# Patient Record
Sex: Male | Born: 1970 | Race: Black or African American | Hispanic: No | Marital: Married | Smoking: Never smoker
Health system: Southern US, Community
[De-identification: ages and names within clinical notes are randomized; demographics above are authoritative.]

## PROBLEM LIST (undated history)

## (undated) DIAGNOSIS — I10 Essential (primary) hypertension: Secondary | ICD-10-CM

## (undated) DIAGNOSIS — N289 Disorder of kidney and ureter, unspecified: Secondary | ICD-10-CM

## (undated) DIAGNOSIS — E785 Hyperlipidemia, unspecified: Secondary | ICD-10-CM

## (undated) HISTORY — DX: Hyperlipidemia, unspecified: E78.5

## (undated) HISTORY — PX: BACK SURGERY: SHX140

---

## 1999-05-27 ENCOUNTER — Emergency Department (HOSPITAL_COMMUNITY): Admission: EM | Admit: 1999-05-27 | Discharge: 1999-05-27 | Payer: Self-pay | Admitting: Emergency Medicine

## 1999-07-12 ENCOUNTER — Emergency Department (HOSPITAL_COMMUNITY): Admission: EM | Admit: 1999-07-12 | Discharge: 1999-07-12 | Payer: Self-pay | Admitting: Emergency Medicine

## 1999-07-12 ENCOUNTER — Encounter: Payer: Self-pay | Admitting: Emergency Medicine

## 2000-02-23 ENCOUNTER — Emergency Department (HOSPITAL_COMMUNITY): Admission: EM | Admit: 2000-02-23 | Discharge: 2000-02-23 | Payer: Self-pay | Admitting: Emergency Medicine

## 2000-02-26 ENCOUNTER — Encounter: Payer: Self-pay | Admitting: Emergency Medicine

## 2000-02-26 ENCOUNTER — Emergency Department (HOSPITAL_COMMUNITY): Admission: EM | Admit: 2000-02-26 | Discharge: 2000-02-26 | Payer: Self-pay | Admitting: Emergency Medicine

## 2000-11-03 ENCOUNTER — Encounter: Payer: Self-pay | Admitting: Emergency Medicine

## 2000-11-03 ENCOUNTER — Observation Stay (HOSPITAL_COMMUNITY): Admission: EM | Admit: 2000-11-03 | Discharge: 2000-11-04 | Payer: Self-pay | Admitting: Emergency Medicine

## 2000-11-03 ENCOUNTER — Encounter: Payer: Self-pay | Admitting: Family Medicine

## 2001-08-21 ENCOUNTER — Emergency Department (HOSPITAL_COMMUNITY): Admission: EM | Admit: 2001-08-21 | Discharge: 2001-08-21 | Payer: Self-pay | Admitting: Emergency Medicine

## 2001-09-01 ENCOUNTER — Encounter: Admission: RE | Admit: 2001-09-01 | Discharge: 2001-10-28 | Payer: Self-pay | Admitting: Orthopedic Surgery

## 2001-09-22 ENCOUNTER — Encounter: Payer: Self-pay | Admitting: Orthopedic Surgery

## 2001-09-22 ENCOUNTER — Ambulatory Visit (HOSPITAL_COMMUNITY): Admission: RE | Admit: 2001-09-22 | Discharge: 2001-09-22 | Payer: Self-pay | Admitting: Orthopedic Surgery

## 2002-02-26 ENCOUNTER — Encounter: Payer: Self-pay | Admitting: Neurosurgery

## 2002-03-03 ENCOUNTER — Ambulatory Visit (HOSPITAL_COMMUNITY): Admission: RE | Admit: 2002-03-03 | Discharge: 2002-03-04 | Payer: Self-pay | Admitting: Neurosurgery

## 2002-03-03 ENCOUNTER — Encounter: Payer: Self-pay | Admitting: Neurosurgery

## 2002-04-28 ENCOUNTER — Emergency Department (HOSPITAL_COMMUNITY): Admission: EM | Admit: 2002-04-28 | Discharge: 2002-04-28 | Payer: Self-pay | Admitting: Emergency Medicine

## 2002-05-30 ENCOUNTER — Encounter: Payer: Self-pay | Admitting: Emergency Medicine

## 2002-05-30 ENCOUNTER — Emergency Department (HOSPITAL_COMMUNITY): Admission: EM | Admit: 2002-05-30 | Discharge: 2002-05-30 | Payer: Self-pay | Admitting: *Deleted

## 2003-03-03 ENCOUNTER — Emergency Department (HOSPITAL_COMMUNITY): Admission: AD | Admit: 2003-03-03 | Discharge: 2003-03-03 | Payer: Self-pay | Admitting: Family Medicine

## 2003-03-25 ENCOUNTER — Emergency Department (HOSPITAL_COMMUNITY): Admission: AD | Admit: 2003-03-25 | Discharge: 2003-03-25 | Payer: Self-pay | Admitting: Family Medicine

## 2003-04-07 ENCOUNTER — Emergency Department (HOSPITAL_COMMUNITY): Admission: AD | Admit: 2003-04-07 | Discharge: 2003-04-07 | Payer: Self-pay | Admitting: Family Medicine

## 2003-06-23 ENCOUNTER — Emergency Department (HOSPITAL_COMMUNITY): Admission: AD | Admit: 2003-06-23 | Discharge: 2003-06-23 | Payer: Self-pay | Admitting: Emergency Medicine

## 2003-08-17 ENCOUNTER — Emergency Department (HOSPITAL_COMMUNITY): Admission: AD | Admit: 2003-08-17 | Discharge: 2003-08-17 | Payer: Self-pay | Admitting: Family Medicine

## 2004-09-22 ENCOUNTER — Emergency Department (HOSPITAL_COMMUNITY): Admission: EM | Admit: 2004-09-22 | Discharge: 2004-09-22 | Payer: Self-pay | Admitting: Emergency Medicine

## 2005-02-08 ENCOUNTER — Emergency Department (HOSPITAL_COMMUNITY): Admission: EM | Admit: 2005-02-08 | Discharge: 2005-02-08 | Payer: Self-pay | Admitting: Emergency Medicine

## 2005-10-28 ENCOUNTER — Emergency Department (HOSPITAL_COMMUNITY): Admission: EM | Admit: 2005-10-28 | Discharge: 2005-10-29 | Payer: Self-pay | Admitting: Emergency Medicine

## 2005-11-02 ENCOUNTER — Emergency Department (HOSPITAL_COMMUNITY): Admission: EM | Admit: 2005-11-02 | Discharge: 2005-11-03 | Payer: Self-pay | Admitting: Emergency Medicine

## 2005-11-02 ENCOUNTER — Emergency Department (HOSPITAL_COMMUNITY): Admission: EM | Admit: 2005-11-02 | Discharge: 2005-11-02 | Payer: Self-pay | Admitting: Family Medicine

## 2005-12-04 ENCOUNTER — Ambulatory Visit: Payer: Self-pay | Admitting: Family Medicine

## 2006-01-01 ENCOUNTER — Ambulatory Visit: Payer: Self-pay | Admitting: Family Medicine

## 2006-01-08 ENCOUNTER — Ambulatory Visit: Payer: Self-pay | Admitting: *Deleted

## 2006-02-01 ENCOUNTER — Ambulatory Visit: Payer: Self-pay | Admitting: Family Medicine

## 2006-02-22 ENCOUNTER — Ambulatory Visit: Payer: Self-pay | Admitting: Family Medicine

## 2006-02-22 DIAGNOSIS — Z8719 Personal history of other diseases of the digestive system: Secondary | ICD-10-CM

## 2006-03-21 ENCOUNTER — Ambulatory Visit: Payer: Self-pay | Admitting: Family Medicine

## 2006-04-18 ENCOUNTER — Ambulatory Visit: Payer: Self-pay | Admitting: Family Medicine

## 2006-10-08 ENCOUNTER — Ambulatory Visit: Payer: Self-pay | Admitting: Family Medicine

## 2006-10-08 DIAGNOSIS — M545 Low back pain: Secondary | ICD-10-CM

## 2006-10-11 ENCOUNTER — Ambulatory Visit: Payer: Self-pay | Admitting: Internal Medicine

## 2006-10-25 ENCOUNTER — Ambulatory Visit: Payer: Self-pay | Admitting: Internal Medicine

## 2006-11-15 ENCOUNTER — Ambulatory Visit: Payer: Self-pay | Admitting: Internal Medicine

## 2006-12-30 DIAGNOSIS — E7800 Pure hypercholesterolemia, unspecified: Secondary | ICD-10-CM | POA: Insufficient documentation

## 2006-12-30 DIAGNOSIS — E785 Hyperlipidemia, unspecified: Secondary | ICD-10-CM

## 2006-12-30 DIAGNOSIS — M109 Gout, unspecified: Secondary | ICD-10-CM

## 2006-12-30 DIAGNOSIS — I10 Essential (primary) hypertension: Secondary | ICD-10-CM

## 2006-12-31 ENCOUNTER — Telehealth (INDEPENDENT_AMBULATORY_CARE_PROVIDER_SITE_OTHER): Payer: Self-pay | Admitting: Internal Medicine

## 2007-01-12 DIAGNOSIS — K219 Gastro-esophageal reflux disease without esophagitis: Secondary | ICD-10-CM | POA: Insufficient documentation

## 2007-01-22 ENCOUNTER — Encounter (INDEPENDENT_AMBULATORY_CARE_PROVIDER_SITE_OTHER): Payer: Self-pay | Admitting: *Deleted

## 2007-01-24 ENCOUNTER — Telehealth (INDEPENDENT_AMBULATORY_CARE_PROVIDER_SITE_OTHER): Payer: Self-pay | Admitting: Family Medicine

## 2007-02-03 ENCOUNTER — Telehealth (INDEPENDENT_AMBULATORY_CARE_PROVIDER_SITE_OTHER): Payer: Self-pay | Admitting: Internal Medicine

## 2007-03-15 ENCOUNTER — Encounter (INDEPENDENT_AMBULATORY_CARE_PROVIDER_SITE_OTHER): Payer: Self-pay | Admitting: Internal Medicine

## 2007-04-09 ENCOUNTER — Telehealth (INDEPENDENT_AMBULATORY_CARE_PROVIDER_SITE_OTHER): Payer: Self-pay | Admitting: Internal Medicine

## 2007-04-23 ENCOUNTER — Telehealth (INDEPENDENT_AMBULATORY_CARE_PROVIDER_SITE_OTHER): Payer: Self-pay | Admitting: Internal Medicine

## 2007-11-26 ENCOUNTER — Emergency Department (HOSPITAL_COMMUNITY): Admission: EM | Admit: 2007-11-26 | Discharge: 2007-11-27 | Payer: Self-pay | Admitting: Emergency Medicine

## 2008-05-12 ENCOUNTER — Emergency Department (HOSPITAL_COMMUNITY): Admission: EM | Admit: 2008-05-12 | Discharge: 2008-05-12 | Payer: Self-pay | Admitting: Family Medicine

## 2009-01-04 ENCOUNTER — Emergency Department (HOSPITAL_COMMUNITY): Admission: EM | Admit: 2009-01-04 | Discharge: 2009-01-04 | Payer: Self-pay | Admitting: Family Medicine

## 2009-10-24 ENCOUNTER — Emergency Department (HOSPITAL_COMMUNITY): Admission: EM | Admit: 2009-10-24 | Discharge: 2009-10-24 | Payer: Self-pay | Admitting: Emergency Medicine

## 2010-09-15 ENCOUNTER — Other Ambulatory Visit: Payer: Self-pay | Admitting: Family Medicine

## 2010-09-15 DIAGNOSIS — G8929 Other chronic pain: Secondary | ICD-10-CM

## 2010-09-18 ENCOUNTER — Ambulatory Visit
Admission: RE | Admit: 2010-09-18 | Discharge: 2010-09-18 | Disposition: A | Discharge status: Home / Self Care | Payer: BC Managed Care – HMO | Source: Ambulatory Visit | Attending: Family Medicine | Admitting: Family Medicine

## 2010-09-18 DIAGNOSIS — G8929 Other chronic pain: Secondary | ICD-10-CM

## 2010-09-22 NOTE — Discharge Summary (Signed)
[facility]  Patient:    Martin [name], Martin [name]                       MRN: [id number] Adm. Date:  [id number] Disc. Date: [id number] Attending:  Lanell Persons CC:         Peter M. [place], M.D.  Vikki Ports, M.D.   Discharge Summary  DATE OF BIRTH:  10-31-1970.  CONSULTANTS:  None.  PROCEDURES:  None.  DISCHARGE DIAGNOSES: 1. Atypical chest pain. 2. Elevated creatinine kinase secondary to dehydration and muscle exertion. 3. Obesity. 4. Gastroesophageal reflux disease by history. 5. Early family history of coronary artery disease.  FOLLOW-UP:  Exercise treadmill test by Dr. Peter M. [place], November 11, 2000 at 10:30 a.m.  The patient is otherwise advised to schedule a follow-up with his primary care physician at [facility].  DISCHARGE MEDICATIONS: 1. Nitroglycerin 0.4 mg sublingual p.r.n. 2. Over-the-counter H2 blocker or antiacid p.r.n. heartburn.  HISTORY OF PRESENT ILLNESS:  Martin [name] is a 40 year old man who presented to the emergency room after a two-day history of intermittent chest discomfort. He had been in his usual state of health until the day before admission when in the evening hours he developed left anterior/substernal chest "squeezing" discomfort accompanied by shortness of breath, nausea, palpitations, dizziness, or arm symptoms.  The pain persisted and was present as he went to sleep Saturday night, began soon after awakening on Sunday morning, and then diminished.  It recurred during an inactive state, unassociated with meals. As it persisted through the day, he sought attention in the emergency department.  He has never had this major chest pain before, although does suffer from some burning-type heartburn pain three to four times per month which he treats with Tums successfully.  The pain is never severe enough to cause regurgitation nor radiates to the back.  He has never had exertional symptoms,  with most recent exertion being a strenuous basketball four days prior to the onset of this chest discomfort.  He is under significant stress.  He is a father of two young children by different mothers with a third baby on the way.  He is working two jobs encompassing seven days a week with very rare time off and is having some difficulty meeting his bill obligations.  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  He has not had time to seek a higher paying job.  SOCIAL HISTORY:  Alcohol consumption is two to three shots per week or three or four beers three times a week.  He smokes marijuana with last episode one week ago.  He has not used IV drugs.  FAMILY HISTORY:  Notable for father who died of MI in his 71s when the patient was a child.  His mother has a history of hypertension.  For further details of admission presentation, please see written report by Dr. Vikki Ports, admitting physician on unassigned call.  HOSPITAL COURSE:  Workup in the emergency room revealed Martin [name] to have normal vital signs with pulse oximetry of 96%, blood pressure 141/81, pulse 72, respirations 18, and normal temperature.  His habitus was obese.  Workup initiated in the ER included a chest CT, spiral protocol with no evidence of PE, followed by a VQ scan also normal.  Portable chest x-ray was normal.  ABG on room air included pH of 7.39, pCO2 of 44, and pO2 of 76.4. Initial  total CK was 1766 with MB fraction 6.1, troponin I 0.03.  Urinalysis notably concentrated with specific gravity of 1.040.  Otherwise negative. CMET was within normal limits except for a mild elevation of AST and ALT in the low 40s.  Coagulation was normal.  Hemoglobin 15.6, white count 5.1 with normal platelets.  EKG shows normal sinus rhythm.  Pulse 67 with poor R-wave progression in the precordial leads and mild T-wave inversion in the lateral precordial leads as well as  lead III.  There is no ST elevation and no Q-waves.  This was followed up the following day, with notable changes including normalization of T-segments in lateral precordial leads and good R-wave progression compared to admission.  Note that both EKGs were when pain-free.  The patient was pain-free on arrival.  Martin [name] repeat cardiac enzymes ruled out cardiac ischemia.  He had no recurrence of chest discomfort.  His symptoms are atypical, given the absence of exertional component when he was most active just a few days ago.  His only risk factor is early family history.  Because his admission EKG was not entirely normal and given the change in his symptoms compared to his usual heartburn discomfort, I will schedule a referral for an outpatient exercise treadmill test.  Note that Dr. Lacretia Nicks. Ashley Royalty. was on call for unassigned cardiology last night-upon hearing Martin [name] story, he felt that the issues were not cardiac and would not require his services.  Because he was on call the night of this admission, I will refer to [facility].  Martin [name] elevated total CPK is secondary to marked physical exertion in context of dehydration.  With rehydration, his CPK has improved from 1700 to 1000.  He has no myalgia.  Urinalysis is negative for blood indicating the likely absence of mild hemoglobinuria.  He is advised to hydrate aggressively when exercising under extremely hot and humid circumstances.  He understands.  DISCHARGE INSTRUCTIONS:  We will discharge in stable and asymptomatic condition, to treat his intermittent heartburn symptoms with over-the-counter medicines (he is unable to afford prescriptions at this time), and sublingual nitroglycerin should his squeezing chest discomfort return.  He is advised to follow up with his primary care physician who I understand is at [facility] assigned to Dr. Gaynell Face L. Chambliss.DD:  11/04/00 TD:  11/04/00 Job:  [id number]

## 2010-09-22 NOTE — Op Note (Signed)
NAME:  [name], [name]                          ACCOUNT NO.:  [id number]   MEDICAL RECORD NO.:  [id number]                   PATIENT TYPE:  OIB   LOCATION:  3172                                 FACILITY:  [facility]   PHYSICIAN:  Martin [name], M.D.                 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   PROCEDURE:  Right L4-5 laminotomy with microdiskectomy.   SURGEON:  Martin [name], M.D.   ASSISTANT:  Martin [name], M.D.   ANESTHESIA:  General endotracheal.   INDICATIONS:  The patient is a 40 year old black male with a history of  severe back and right lower extremity pain, paresthesias, and weakness  consistent with a right-sided L5 radiculopathy, which has failed  conservative management.  MRI scan demonstrates a large rightward L4-5 disk  herniation compressing the thecal sac and exiting right-sided L5 nerve root.  The patient has been counseled as to his options.  He has failed  conservative management.  He has decided to proceed with a right-sided L4-5  laminotomy and microdiskectomy.   DESCRIPTION OF PROCEDURE:  The patient was brought into the operating room  and placed on the operating table in the supine position.  After an adequate  level of anesthesia achieved, the patient was positioned prone onto a Wilson  frame and appropriately padded.  The patient's lumbar region was prepped and  draped sterilely.  A 10 blade was used to make a linear skin incision  overlying the L4-5 interspace.  This was carried down sharply in the  midline.  A subperiosteal dissection then performed, exposing the laminae  and facet joints of L4-5.  A deep self-retaining retractor was placed,  intraoperative x-ray was taken, and the  level was confirmed.  A laminotomy  was then performed using a high-speed drill and Kerrison rongeurs to remove  the inferior one-third of the lamina of L4, the medial edge of the L4-5  facet joint, and the superior rim of the L5 lamina.  The ligamentum flavum  was then elevated and resected in piecemeal fashion using Kerrison rongeurs.  The underlying thecal sac was identified, as was the exiting L5 nerve root.   The microscope was brought into the field and using microdissection of the  right-sided L5 nerve root underlying disk herniation.  Epidural venous  plexus coagulated and cut.  Thecal sac and L5 nerve root were mobilized and  retracted toward the midline.  The disk space was then incised with a 15  blade in a rectangular fashion.  A wide disk space clean-out was then  achieved using pituitary rongeurs, upward-angled pituitary rongeurs, and  Epstein curettes.  All elements of  the disk herniation were completely  resected.  All loose or obviously degenerative disk material was removed  from the interspace.  At this point a very thorough diskectomy had been  performed.  The wound was then irrigated with antibiotic solution.  There  was no evidence of injury to thecal sac or nerve roots.  Gelfoam was placed  topically for hemostasis, which was found to be good.  The microscope and  retraction system were removed.  Hemostasis in the muscle was achieved with electrocautery.  The wounds were  closed in layers with Vicryl sutures.  Steri-Strips and a sterile dressing  were applied.  There were no apparent complications.  The patient tolerated  the procedure well, and he returns to recovery postop.                                               Martin [name], M.D.    HAP/MEDQ  D:  03/03/2002  T:  03/03/2002  Job:  [id number]

## 2011-03-23 ENCOUNTER — Emergency Department (HOSPITAL_COMMUNITY)
Admission: EM | Admit: 2011-03-23 | Discharge: 2011-03-23 | Disposition: A | Discharge status: Home / Self Care | Payer: BC Managed Care – HMO | Source: Home / Self Care

## 2011-03-23 ENCOUNTER — Encounter: Payer: Self-pay | Admitting: *Deleted

## 2011-03-23 DIAGNOSIS — M109 Gout, unspecified: Secondary | ICD-10-CM

## 2011-03-23 HISTORY — DX: Essential (primary) hypertension: I10

## 2011-03-23 LAB — CBC
HCT: 45.5 % (ref 39.0–52.0)
Hemoglobin: 15.5 g/dL (ref 13.0–17.0)
MCH: 26.9 pg (ref 26.0–34.0)
MCHC: 34.1 g/dL (ref 30.0–36.0)
MCV: 78.9 fL (ref 78.0–100.0)
Platelets: 222 10*3/uL (ref 150–400)
RBC: 5.77 MIL/uL (ref 4.22–5.81)
RDW: 15.7 % — ABNORMAL HIGH (ref 11.5–15.5)
WBC: 9.1 10*3/uL (ref 4.0–10.5)

## 2011-03-23 LAB — DIFFERENTIAL
Basophils Absolute: 0 10*3/uL (ref 0.0–0.1)
Basophils Relative: 0 % (ref 0–1)
Eosinophils Absolute: 0.2 10*3/uL (ref 0.0–0.7)
Eosinophils Relative: 2 % (ref 0–5)
Lymphocytes Relative: 15 % (ref 12–46)
Lymphs Abs: 1.4 10*3/uL (ref 0.7–4.0)
Monocytes Absolute: 0.9 10*3/uL (ref 0.1–1.0)
Monocytes Relative: 10 % (ref 3–12)
Neutro Abs: 6.6 10*3/uL (ref 1.7–7.7)
Neutrophils Relative %: 72 % (ref 43–77)

## 2011-03-23 LAB — URIC ACID: Uric Acid, Serum: 5.9 mg/dL (ref 4.0–7.8)

## 2011-03-23 MED ORDER — HYDROCODONE-ACETAMINOPHEN 5-325 MG PO TABS
ORAL_TABLET | ORAL | Status: AC
  Filled 2011-03-23: qty 2

## 2011-03-23 MED ORDER — PREDNISONE 20 MG PO TABS
ORAL_TABLET | ORAL | Status: AC
  Filled 2011-03-23: qty 3

## 2011-03-23 MED ORDER — HYDROCODONE-ACETAMINOPHEN 5-325 MG PO TABS
2.0000 | ORAL_TABLET | Freq: Once | ORAL | Status: AC
  Administered 2011-03-23: 2 via ORAL

## 2011-03-23 MED ORDER — HYDROCODONE-ACETAMINOPHEN 7.5-750 MG PO TABS: 1.0000 | ORAL_TABLET | Freq: Four times a day (QID) | ORAL | Status: AC | PRN

## 2011-03-23 MED ORDER — PREDNISONE 20 MG PO TABS
60.0000 mg | ORAL_TABLET | Freq: Once | ORAL | Status: AC
  Administered 2011-03-23: 60 mg via ORAL

## 2011-03-23 MED ORDER — PREDNISONE 10 MG PO TABS: ORAL_TABLET | ORAL | Status: AC

## 2011-03-23 NOTE — ED Provider Notes (Signed)
History     CSN: [id number] Arrival date & time: 03/23/2011  8:02 PM   None     Chief Complaint  Patient presents with  . Knee Pain    (Consider location/radiation/quality/duration/timing/severity/associated sxs/prior treatment) HPI Comments: Hx of gout - recurrent in Lt knee. Last flare up one mos ago. Pt states his PCP started him on Allopurinol daily approx 5 weeks ago, and takes cholchicine prn for flare ups.   Patient is a 40 y.o. male presenting with knee pain. The history is provided by the patient.  Knee Pain This is a recurrent problem. Episode onset: 4 days ago. The problem occurs constantly. The problem has been gradually worsening. Pertinent negatives include no chest pain, no abdominal pain, no headaches and no shortness of breath. The symptoms are aggravated by walking, bending and standing. The symptoms are relieved by nothing. Treatments tried: colchicine and Tylenol 3. The treatment provided mild relief.    Past Medical History  Diagnosis Date  . Hypertension   . Gout     Past Surgical History  Procedure Date  . Back surgery     Family History  Problem Relation Age of Onset  . Hypertension Mother   . Diabetes Mother   . Gout Mother   . Hypertension Father   . Heart failure Father     History  Substance Use Topics  . Smoking status: Not on file  . Smokeless tobacco: Not on file  . Alcohol Use: Yes     socially      Review of Systems  Constitutional: Negative for fever and chills.  Respiratory: Negative for shortness of breath.   Cardiovascular: Negative for chest pain.  Gastrointestinal: Negative for abdominal pain.  Musculoskeletal: Positive for joint swelling.  Neurological: Negative for headaches.    Allergies  Lotensin  Home Medications   Current Outpatient Rx  Name Route Sig Dispense Refill  . ALLOPURINOL 300 MG PO TABS Oral Take 300 mg by mouth daily.      Marland Kitchen AMLODIPINE BESYLATE 10 MG PO TABS Oral Take 10 mg by mouth daily.       . COLCHICINE 0.6 MG PO TABS Oral Take 0.6 mg by mouth 2 (two) times daily.      . FUROSEMIDE 40 MG PO TABS Oral Take 40 mg by mouth 2 (two) times daily.      Marland Kitchen PRAVASTATIN SODIUM 20 MG PO TABS Oral Take 20 mg by mouth daily.      Marland Kitchen HYDROCODONE-ACETAMINOPHEN 7.5-750 MG PO TABS Oral Take 1 tablet by mouth every 6 (six) hours as needed for pain. 12 tablet 0  . PREDNISONE 10 MG PO TABS  Take 5 tabs day 1, then 4 tabs day 2, then 3 tabs day 3, then 2 tabs day 4, then 1 tab day 5 15 tablet 0    BP 150/62  Pulse 82  Temp(Src) 100.2 F (37.9 C) (Oral)  Resp 18  SpO2 100%  Physical Exam  Nursing note and vitals reviewed. Constitutional: He appears well-developed and well-nourished.       Uncomfortable apeparing  Cardiovascular: Normal rate, regular rhythm and normal heart sounds.   Pulmonary/Chest: Effort normal and breath sounds normal. No respiratory distress.  Musculoskeletal:       Left knee: He exhibits decreased range of motion, swelling (anterior knee swelling ) and erythema (mild erythema anterior knee and mild warmth to touch). He exhibits no effusion and no ecchymosis. tenderness (TTP anterior knee - peripatellar) found. Medial joint line and  lateral joint line tenderness noted. No MCL, no LCL and no patellar tendon tenderness noted.  Neurological: He is alert.  Skin: No abrasion and no rash noted.  Psychiatric: He has a normal mood and affect.    ED Course  Procedures (including critical care time)   Labs Reviewed  CBC  DIFFERENTIAL  URIC ACID   No results found.   1. Gout attack       MDM  Hx of recurrent gout flare up Lt knee, onset of symptoms 4 days ago, same as previous flare ups.         Melody Comas, [place] 03/23/11 2109

## 2011-03-23 NOTE — ED Notes (Signed)
Pt  Seen recently  For  Gout    Pt reports   Reports  5  Days  Of pain  Swelling  Warm  Sensation to  l  Knee  Pain on  Weight  Bearing as  Well   denys  Any  specefic injury  Ambulating on crutches

## 2011-03-25 NOTE — ED Provider Notes (Signed)
History     CSN: [id number] Arrival date & time: 03/23/2011  8:02 PM   None     Chief Complaint  Patient presents with  . Knee Pain    (Consider location/radiation/quality/duration/timing/severity/associated sxs/prior treatment) HPI  Past Medical History  Diagnosis Date  . Hypertension   . Gout     Past Surgical History  Procedure Date  . Back surgery     Family History  Problem Relation Age of Onset  . Hypertension Mother   . Diabetes Mother   . Gout Mother   . Hypertension Father   . Heart failure Father     History  Substance Use Topics  . Smoking status: Not on file  . Smokeless tobacco: Not on file  . Alcohol Use: Yes     socially      Review of Systems  Allergies  Lotensin  Home Medications   Current Outpatient Rx  Name Route Sig Dispense Refill  . ALLOPURINOL 300 MG PO TABS Oral Take 300 mg by mouth daily.      Marland Kitchen AMLODIPINE BESYLATE 10 MG PO TABS Oral Take 10 mg by mouth daily.      . COLCHICINE 0.6 MG PO TABS Oral Take 0.6 mg by mouth 2 (two) times daily.      . FUROSEMIDE 40 MG PO TABS Oral Take 40 mg by mouth 2 (two) times daily.      Marland Kitchen PRAVASTATIN SODIUM 20 MG PO TABS Oral Take 20 mg by mouth daily.      Marland Kitchen HYDROCODONE-ACETAMINOPHEN 7.5-750 MG PO TABS Oral Take 1 tablet by mouth every 6 (six) hours as needed for pain. 12 tablet 0  . PREDNISONE 10 MG PO TABS  Take 5 tabs day 1, then 4 tabs day 2, then 3 tabs day 3, then 2 tabs day 4, then 1 tab day 5 15 tablet 0    BP 150/62  Pulse 82  Temp(Src) 100.2 F (37.9 C) (Oral)  Resp 18  SpO2 100%  Physical Exam  ED Course  Procedures (including critical care time)  Labs Reviewed  CBC - Abnormal; Notable for the following:    RDW 15.7 (*)    All other components within normal limits  DIFFERENTIAL  URIC ACID  LAB REPORT - SCANNED   No results found.   1. Gout attack       MDM  Medical screening examination/treatment/procedure(s) were performed by non-physician practitioner  and as supervising physician I was immediately available for consultation/collaboration.  Luiz Blare MD   Luiz Blare, MD 03/25/11 2039

## 2011-03-26 NOTE — ED Provider Notes (Signed)
Medical screening examination/treatment/procedure(s) were performed by non-physician practitioner and as supervising physician I was immediately available for consultation/collaboration.  Norleen Xie M. MD  Marquia Costello M Jatasia Gundrum, MD 03/26/11 1006 

## 2012-01-22 ENCOUNTER — Emergency Department (HOSPITAL_COMMUNITY)
Admission: EM | Admit: 2012-01-22 | Discharge: 2012-01-22 | Disposition: A | Discharge status: Home / Self Care | Payer: 59 | Attending: Emergency Medicine | Admitting: Emergency Medicine

## 2012-01-22 ENCOUNTER — Encounter (HOSPITAL_COMMUNITY): Payer: Self-pay | Admitting: *Deleted

## 2012-01-22 DIAGNOSIS — M25569 Pain in unspecified knee: Secondary | ICD-10-CM | POA: Insufficient documentation

## 2012-01-22 DIAGNOSIS — I10 Essential (primary) hypertension: Secondary | ICD-10-CM | POA: Insufficient documentation

## 2012-01-22 DIAGNOSIS — M109 Gout, unspecified: Secondary | ICD-10-CM | POA: Insufficient documentation

## 2012-01-22 MED ORDER — HYDROCODONE-ACETAMINOPHEN 5-325 MG PO TABS
2.0000 | ORAL_TABLET | Freq: Once | ORAL | Status: AC
  Administered 2012-01-22: 2 via ORAL
  Filled 2012-01-22: qty 2

## 2012-01-22 MED ORDER — PREDNISONE 20 MG PO TABS: ORAL_TABLET | ORAL | Status: DC

## 2012-01-22 NOTE — ED Notes (Signed)
Reports has some cramping to posterior knee. Mild edema noted to knee. Reports pain worse with mvmt, ambulating & very tender.

## 2012-01-22 NOTE — ED Notes (Signed)
C/o of right knee pain since Sunday. Denies injury. States it's his gout acting up

## 2012-01-22 NOTE — ED Provider Notes (Signed)
History     CSN: [id number]  Arrival date & time 01/22/12  0805   First MD Initiated Contact with Patient 01/22/12 [phone]      Chief Complaint  Patient presents with  . Knee Pain    (Consider location/radiation/quality/duration/timing/severity/associated sxs/prior treatment) HPI Comments: 41 year old male with a history of gout presents the emergency department with right-sided knee pain since Sunday. He states this feels like his normal gout flareups. He describes the pain as throbbing, rated 9/10, worse with movement. States his knee feels swollen. Denies any fever, chills, nausea, vomiting, chest pain, shortness of breath, or urinary complaints. Admits to about 2 gout flareups per year. He takes allopurinol daily and also has colchicine.  Patient is a 41 y.o. male presenting with knee pain. The history is provided by the patient.  Knee Pain Associated symptoms include arthralgias (right knee) and joint swelling. Pertinent negatives include no chest pain, chills, fever, nausea, numbness or vomiting.    Past Medical History  Diagnosis Date  . Hypertension   . Gout     Past Surgical History  Procedure Date  . Back surgery     Family History  Problem Relation Age of Onset  . Hypertension Mother   . Diabetes Mother   . Gout Mother   . Hypertension Father   . Heart failure Father     History  Substance Use Topics  . Smoking status: Not on file  . Smokeless tobacco: Not on file  . Alcohol Use: Yes     socially      Review of Systems  Constitutional: Negative for fever and chills.  Respiratory: Negative for shortness of breath.   Cardiovascular: Negative for chest pain.  Gastrointestinal: Negative for nausea and vomiting.  Genitourinary:       Denies any urinary complaints  Musculoskeletal: Positive for joint swelling and arthralgias (right knee).  Skin: Negative for color change.  Neurological: Negative for numbness.    Allergies  Benazepril hcl  Home  Medications   Current Outpatient Rx  Name Route Sig Dispense Refill  . ALLOPURINOL 300 MG PO TABS Oral Take 300 mg by mouth daily.      Marland Kitchen AMLODIPINE BESYLATE 10 MG PO TABS Oral Take 10 mg by mouth daily.      Marland Kitchen CARVEDILOL 25 MG PO TABS Oral Take 25 mg by mouth 2 (two) times daily with a meal.    . COLCHICINE 0.6 MG PO TABS Oral Take 0.6 mg by mouth 2 (two) times daily.      . FUROSEMIDE 40 MG PO TABS Oral Take 80 mg by mouth daily.     Marland Kitchen LOSARTAN POTASSIUM 100 MG PO TABS Oral Take 100 mg by mouth 2 (two) times daily.      BP 161/102  Pulse 77  Temp 98.3 F (36.8 C) (Oral)  Resp 20  Ht 6\' 4"  (1.93 m)  Wt 350 lb (158.759 kg)  BMI 42.60 kg/m2  SpO2 98%  Physical Exam  Nursing note and vitals reviewed. Constitutional: He is oriented to person, place, and time. He appears well-developed and well-nourished. No distress.  HENT:  Head: Normocephalic and atraumatic.  Mouth/Throat: Oropharynx is clear and moist.  Eyes: Conjunctivae normal are normal.  Neck: Normal range of motion. Neck supple.  Cardiovascular: Normal rate, regular rhythm, normal heart sounds and intact distal pulses.   Pulmonary/Chest: Effort normal and breath sounds normal.  Musculoskeletal:       Right knee: He exhibits decreased range of motion (with  flexion due to pain) and swelling (laterally). He exhibits no ecchymosis, no deformity, no erythema and no bony tenderness. tenderness (laterally) found. Lateral joint line tenderness noted.  Neurological: He is alert and oriented to person, place, and time. No sensory deficit. Gait normal.  Skin: Skin is warm, dry and intact. He is not diaphoretic. No erythema.  Psychiatric: He has a normal mood and affect. His behavior is normal.    ED Course  Procedures (including critical care time)  Labs Reviewed - No data to display No results found.   1. Gout attack   2. Knee pain       MDM  41 year old male with gout presents with right knee pain consistent with his  previous gout flareups. He has taken prednisone for his flareup in the past. I will give him a prednisone taper. His pain was managed in the ED today.        Trevor Mace, PA-C 01/22/12 [phone]

## 2012-01-23 NOTE — ED Provider Notes (Signed)
Medical screening examination/treatment/procedure(s) were performed by non-physician practitioner and as supervising physician I was immediately available for consultation/collaboration.   Dione Booze, MD 01/23/12 [phone]

## 2012-03-24 ENCOUNTER — Encounter: Payer: Self-pay | Admitting: Dietician

## 2012-03-24 ENCOUNTER — Encounter: Payer: 59 | Attending: Family Medicine | Admitting: Dietician

## 2012-03-24 VITALS — Ht 74.25 in | Wt 352.3 lb

## 2012-03-24 DIAGNOSIS — E785 Hyperlipidemia, unspecified: Secondary | ICD-10-CM

## 2012-03-24 DIAGNOSIS — Z713 Dietary counseling and surveillance: Secondary | ICD-10-CM | POA: Insufficient documentation

## 2012-03-24 DIAGNOSIS — I10 Essential (primary) hypertension: Secondary | ICD-10-CM | POA: Insufficient documentation

## 2012-03-24 DIAGNOSIS — M109 Gout, unspecified: Secondary | ICD-10-CM

## 2012-03-24 DIAGNOSIS — E669 Obesity, unspecified: Secondary | ICD-10-CM

## 2012-03-24 NOTE — Patient Instructions (Addendum)
Goals:  Mr. Westrope will begin to eat breakfast most days of the week. He will also eat 3 meals each day including a fiber choice and a protein choice. Protein choices include: lean meats (chicken without skin, fish, loin cuts of red meats), dairy (milk and yogurt), nuts, and legumes Fiber choices include: whole grain breads, cereals, and pastas; all vegetables and fruits, nuts, and legumes Mr. Mccullen will limit dairy intake to 2 servings per day (8 oz of milk = 1 serving, or 1 cup of yogurt= 1 serving). Mr. Maskell will avoid legumes due to gout issues.  Mr. Plyler will limit sweet drinks, and focus on water as the drink of choice. Mr. Cyphert will choose fruits as primary sweet. Mr. Spizzirri will choose leaner options at restaurants (grilled chicken in place of double cheeseburger). Mr. Donaghy will eat plates half full of vegetables, and one quarter full of proteins and starches at lunch at dinner.   Mr. Steever will incorporate physical activity into his daily life where possible, by doing upper body exercises with weights at home, some walking, some rope skipping or play with his daughter where possible. His goal is to accumulate 30 minutes of physical activity most days of the week.

## 2012-03-24 NOTE — Progress Notes (Signed)
Assessment:  Pt works as a Veterinary surgeon for mentally disabled. 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 Pt does not report significant regular hunger. Pt claims he typically eats based on timing of day. Pt reports limited starch intake b/c his wife cooks and she usually limits starch for variations of low CHO diets.   Recall- Breakfast: usually none with 30 oz. water (with meds); sometimes sausage, egg, and cheese biscuits (fast food breakfast) with orange juice. Snack (AM): usually none; sometimes donuts or cookies, rarely fruit Lunch: Fast food restaurant- double cheeseburger or Big Mac, sweet tea, and fruited yogurt; sometimes a diner with protein, veg, and starch Snack (PM): usually eats a snack, most often a sweet, like piece of cake. Dinner: greens or some other veg, plus meat (usually baked, sometimes fried). Meat choices include meat, chix, ribs, pork chops, steak. Usually no starch, sometimes two slices of whole wheat bread (plain). Usually drinks water or juice drink with dinner. Usually no dessert.  HS snack: usually none, occasionally cheese crackers.   On Fridays and Saturdays, will drink 3 or 4 drinks of vodka plus voice (cranberry). Not every Friday/Sat.  Pt has been recently making changes to favor water over other beverages.  Since 2000, pt has been on Htn meds.  Pt claims enjoying exercise, but feels limited by his job. He works from [employer], but cares for someone at home. His home care pt is not at home with home every other weekend. Pt's home care pt dislikes exercise. Pt reports very limited activity as a consequence of caring for kids and pt.   Dx: Excess energy intake related to limited physical activity, routine choice of kcal-dense foods, such as sweets and double cheeseburgers, as evidenced by BMI>30, pt reports of food choices and PA.   I: Goals:  Mr. Buttery will begin to eat breakfast most days of the week. He will also eat 3 meals each day including a fiber choice and a protein  choice. Protein choices include: lean meats (chicken without skin, fish, loin cuts of red meats), dairy (milk and yogurt), nuts, and legumes Fiber choices include: whole grain breads, cereals, and pastas; all vegetables and fruits, nuts, and legumes Mr. Amison will limit dairy intake to 2 servings per day (8 oz of milk = 1 serving, or 1 cup of yogurt= 1 serving). Mr. Goetzke will avoid legumes due to gout issues.  Mr. Caporale will limit sweet drinks, and focus on water as the drink of choice. Mr. Norwood will choose fruits as primary sweet. Mr. Robart will choose leaner options at restaurants (grilled chicken in place of double cheeseburger). Mr. Rodriquez will eat plates half full of vegetables, and one quarter full of proteins and starches at lunch at dinner.   Mr. Grima will incorporate physical activity into his daily life where possible, by doing upper body exercises with weights at home, some walking, some rope skipping or play with his daughter where possible. His goal is to accumulate 30 minutes of physical activity most days of the week.  M/E: F/U in 1 month. Goal of 150 minutes per week PA, breakfast eaten 4 days/wk.

## 2012-04-28 ENCOUNTER — Ambulatory Visit: Payer: 59 | Admitting: Dietician

## 2012-05-16 ENCOUNTER — Ambulatory Visit: Payer: 59 | Admitting: Dietician

## 2012-05-23 ENCOUNTER — Encounter: Payer: Self-pay | Admitting: Dietician

## 2012-05-23 ENCOUNTER — Encounter: Payer: 59 | Attending: Family Medicine | Admitting: Dietician

## 2012-05-23 VITALS — Ht 74.25 in | Wt 347.1 lb

## 2012-05-23 DIAGNOSIS — E669 Obesity, unspecified: Secondary | ICD-10-CM | POA: Insufficient documentation

## 2012-05-23 DIAGNOSIS — I10 Essential (primary) hypertension: Secondary | ICD-10-CM | POA: Insufficient documentation

## 2012-05-23 DIAGNOSIS — K219 Gastro-esophageal reflux disease without esophagitis: Secondary | ICD-10-CM

## 2012-05-23 DIAGNOSIS — E785 Hyperlipidemia, unspecified: Secondary | ICD-10-CM | POA: Insufficient documentation

## 2012-05-23 DIAGNOSIS — M109 Gout, unspecified: Secondary | ICD-10-CM | POA: Insufficient documentation

## 2012-05-23 DIAGNOSIS — Z713 Dietary counseling and surveillance: Secondary | ICD-10-CM | POA: Insufficient documentation

## 2012-05-23 NOTE — Progress Notes (Signed)
A: Current wt 347.1 lbs. (about 5.3 lbs wt loss since last visit) Pt states his wt loss has been difficult, but he's been doing it. Pt states he has been avoiding certain foods, like fried and fast foods.   Pt says he has been limiting the foods, but it has been difficult. He eats at the diner style restaurants more often when eating out for lunch. When eating fast food, he normally gets a grilled chix sandwich instead of a Big Mac or large burger. He has been avoiding certain restaurants where he knows he will eat high kcal foods, like french fries.   Pt is eating breakfast 3-4 days per week at minimum, but is doing so most days. Usually boiled eggs, wheat bread, and water. Sometimes fruit.   Pt states he is replacing his sweet drinks with water as much as possible.   Pt is currently increasing walking, but admits he has not really ramped up a full exercise or activity plan. Pt states he is considering joining the [employer] as family, with concerns abut his daughter's weight (34 years old). Pt has been using a step-tracker on his phone, and is now getting over 5000 steps each day, after previous average of [phone]. Pt also uses this program to record his food intake, with a goal of 3500. He states he usually gets about 5000 steps and 2800-300 calories.  Pt is now packing lunch about once per week.   Pt says his knee has been fine except last week, when he felt a pain like his MCL tear. Short duration pain. He rested his leg for about 3 days in response to this and it has been fine since.   Pt has no c/o gout attacks since last meeting. Pt does a good job of avoiding high purine foods likes too much dairy, legumes, [place].   Pt has no c/o of reflux. Pt has noticed reduction of fatty foods has helped reduce occurrence of GERD.   Pt has been pleasantly surprised at his ability to limit sweet food consumption. Only has had about 2 candy bars since last meeting.   Pt is curious for more information about  gout eating tips.  I: RD reinforced multiple very positive behaviors already adopted by pt: tracking activity and food intake, more packing lunch, choosing restaurants with more discretion for food options, increasing walking, eating breakfast regularly, avoiding certain high purine foods, drinking more water, and limiting sweet foods.  RD counseled pt on some aspects of gout diet, mainly to reinforce beneficial new behaviors, such as limiting fatty foods and high fructose corn syrup. Pt explained reasonable portions for meats (about 5 oz. Per sitting for a man his size), and noted easy foods to avoid- limit dairy to 2 servings each day of nonfat or lowfat variety, choose chix over [place], limit alcohol, and avoid legumes for the most part.  RD encouraged pt to strive for full 150 minutes per week of activity, and to increase packing lunch to twice per week.   M/E: F/U in 2 months for wt loss status, activity, continued adherence to diet recommendations.

## 2012-07-25 ENCOUNTER — Ambulatory Visit: Payer: 59 | Admitting: Dietician

## 2012-07-28 ENCOUNTER — Ambulatory Visit: Payer: 59 | Admitting: Dietician

## 2012-08-12 ENCOUNTER — Encounter (HOSPITAL_COMMUNITY): Payer: Self-pay | Admitting: Emergency Medicine

## 2012-08-12 ENCOUNTER — Other Ambulatory Visit: Payer: Self-pay

## 2012-08-12 ENCOUNTER — Emergency Department (HOSPITAL_COMMUNITY)
Admission: EM | Admit: 2012-08-12 | Discharge: 2012-08-12 | Disposition: A | Discharge status: Other Acute Inpatient Hospital | Payer: BC Managed Care – PPO | Source: Home / Self Care | Attending: Emergency Medicine | Admitting: Emergency Medicine

## 2012-08-12 ENCOUNTER — Emergency Department (INDEPENDENT_AMBULATORY_CARE_PROVIDER_SITE_OTHER): Payer: BC Managed Care – PPO

## 2012-08-12 ENCOUNTER — Encounter (HOSPITAL_COMMUNITY): Payer: Self-pay | Admitting: *Deleted

## 2012-08-12 ENCOUNTER — Emergency Department (HOSPITAL_COMMUNITY)
Admission: EM | Admit: 2012-08-12 | Discharge: 2012-08-12 | Disposition: A | Discharge status: Home / Self Care | Payer: BC Managed Care – PPO | Attending: Emergency Medicine | Admitting: Emergency Medicine

## 2012-08-12 DIAGNOSIS — Z8639 Personal history of other endocrine, nutritional and metabolic disease: Secondary | ICD-10-CM | POA: Insufficient documentation

## 2012-08-12 DIAGNOSIS — R079 Chest pain, unspecified: Secondary | ICD-10-CM

## 2012-08-12 DIAGNOSIS — Z862 Personal history of diseases of the blood and blood-forming organs and certain disorders involving the immune mechanism: Secondary | ICD-10-CM | POA: Insufficient documentation

## 2012-08-12 DIAGNOSIS — M109 Gout, unspecified: Secondary | ICD-10-CM | POA: Insufficient documentation

## 2012-08-12 DIAGNOSIS — I10 Essential (primary) hypertension: Secondary | ICD-10-CM | POA: Insufficient documentation

## 2012-08-12 DIAGNOSIS — Z79899 Other long term (current) drug therapy: Secondary | ICD-10-CM | POA: Insufficient documentation

## 2012-08-12 LAB — BASIC METABOLIC PANEL
BUN: 16 mg/dL (ref 6–23)
CO2: 30 mEq/L (ref 19–32)
Calcium: 9.7 mg/dL (ref 8.4–10.5)
Creatinine, Ser: 1.21 mg/dL (ref 0.50–1.35)

## 2012-08-12 LAB — CBC
MCH: 26 pg (ref 26.0–34.0)
MCV: 76.5 fL — ABNORMAL LOW (ref 78.0–100.0)
Platelets: 198 10*3/uL (ref 150–400)
RBC: 6.03 MIL/uL — ABNORMAL HIGH (ref 4.22–5.81)

## 2012-08-12 LAB — POCT I-STAT TROPONIN I: Troponin i, poc: 0 ng/mL (ref 0.00–0.08)

## 2012-08-12 MED ORDER — ASPIRIN 81 MG PO CHEW
CHEWABLE_TABLET | ORAL | Status: AC
  Filled 2012-08-12: qty 4

## 2012-08-12 MED ORDER — ASPIRIN 81 MG PO CHEW
324.0000 mg | CHEWABLE_TABLET | Freq: Once | ORAL | Status: AC
  Administered 2012-08-12: 324 mg via ORAL

## 2012-08-12 NOTE — ED Notes (Signed)
Verbal order given by nurse-kuch to repeat ekg.  ekg repeated and given to dr. Juleen China.

## 2012-08-12 NOTE — ED Provider Notes (Signed)
History     CSN: [id number]  Arrival date & time 08/12/12  1051   First MD Initiated Contact with Patient 08/12/12 1102      Chief Complaint  Patient presents with  . Chest Pain    (Consider location/radiation/quality/duration/timing/severity/associated sxs/prior treatment) HPI Comments: Patient presents urgent care describing that since yesterday has been having upper back pain that is exacerbated with activity movement, denies any cough shortness of breath or recent injury or trauma. He reports that this morning he would woke up with moderate to severe substernal and right-sided chest pain, the pain radiates to his back. Patient denies any nausea, vomiting or diaphoresis. Does describe the pain has improved some spontaneously and is about 2/10 were initially it was 8/10. Felt like a pressure of her nagging sensation.  Patient is a 42 y.o. male presenting with chest pain. The history is provided by the patient.  Chest Pain Pain location:  R chest Pain quality: tightness   Pain radiates to:  Upper back Pain severity:  Mild Onset quality:  Sudden Duration:  2 days Timing:  Constant Progression:  Waxing and waning Context: movement, at rest and trauma   Context: not breathing and no stress   Relieved by:  Nothing Worsened by:  Movement, deep breathing and certain positions Ineffective treatments:  None tried Associated symptoms: back pain   Associated symptoms: no cough, no diaphoresis, no dizziness, no fatigue, no fever, no palpitations and no shortness of breath   Risk factors: hypertension and obesity   Risk factors: no prior DVT/PE     Past Medical History  Diagnosis Date  . Hypertension   . Gout   . Hyperlipidemia     Past Surgical History  Procedure Laterality Date  . Back surgery      Family History  Problem Relation Age of Onset  . Hypertension Mother   . Diabetes Mother   . Gout Mother   . Hypertension Father   . Heart failure Father   . Cancer Maternal  Grandmother   . Cancer Maternal Grandfather     History  Substance Use Topics  . Smoking status: Never Smoker   . Smokeless tobacco: Never Used  . Alcohol Use: Yes     Comment: socially      Review of Systems  Constitutional: Positive for activity change. Negative for fever, chills, diaphoresis, appetite change, fatigue and unexpected weight change.  HENT: Negative for neck pain.   Respiratory: Negative for cough and shortness of breath.   Cardiovascular: Positive for chest pain. Negative for palpitations and leg swelling.  Musculoskeletal: Positive for back pain. Negative for myalgias, joint swelling and gait problem.  Skin: Negative for color change, pallor, rash and wound.  Neurological: Negative for dizziness.    Allergies  Benazepril hcl  Home Medications   Current Outpatient Rx  Name  Route  Sig  Dispense  Refill  . allopurinol (ZYLOPRIM) 300 MG tablet   Oral   Take 300 mg by mouth daily.           Marland Kitchen amLODipine (NORVASC) 10 MG tablet   Oral   Take 10 mg by mouth daily.           . carvedilol (COREG) 25 MG tablet   Oral   Take 25 mg by mouth 2 (two) times daily with a meal.         . colchicine 0.6 MG tablet   Oral   Take 0.6 mg by mouth as needed.          Marland Kitchen  furosemide (LASIX) 40 MG tablet   Oral   Take 80 mg by mouth daily.          Marland Kitchen losartan (COZAAR) 100 MG tablet   Oral   Take 100 mg by mouth 2 (two) times daily.         . predniSONE (DELTASONE) 20 MG tablet      Take 3 tabs PO x 2 days followed by 2 tabs PO x 2 days followed by 1 tab PO x 2 days   12 tablet   0     BP 129/65  Pulse 70  Temp(Src) 98.1 F (36.7 C) (Oral)  Resp 18  SpO2 98%  Physical Exam  Nursing note reviewed. Constitutional: Vital signs are normal. He appears distressed.  Eyes: Conjunctivae are normal.  Neck: Neck supple. No JVD present.  Cardiovascular: Normal rate.  Exam reveals no gallop.   No murmur heard. Pulmonary/Chest: Effort normal and breath  sounds normal. No respiratory distress. He has no decreased breath sounds. He has no wheezes. He has no rhonchi. He has no rales.   He exhibits no tenderness.    Lymphadenopathy:    He has no cervical adenopathy.  Skin: No rash noted. No erythema.    ED Course  Procedures (including critical care time)  Labs Reviewed - No data to display Dg Chest 2 View  08/12/2012  *RADIOLOGY REPORT*  Clinical Data: Chest pain.  CHEST - 2 VIEW  Comparison: Sep 22, 2004.  Findings: Cardiomediastinal silhouette appears normal.  No acute pulmonary disease is noted.  Bony thorax is intact.  IMPRESSION: No acute cardiopulmonary abnormality seen.   Original Report Authenticated By: Lupita Raider.,  M.D.      1. Chest pain at rest    EKG normal sinus rhythm ventricular rate of 61 beats per minute PR and QRS duration within normal no acute T. prolongation nonspecific T. wave inversion isolated to Lead III. no ST segment elevation or depression to suggest acute ischemia.   MDM  Atypical sudden onset chest pain- patient without any respiratory symptoms, suspicious for musculoskeletal chest pain given patient's risk factor and pain at rest we'll transfer to the emergency department to be consider first set of cardiac markers. Patient is hemodynamically stable with an unchanged EKG        Jimmie Molly, MD 08/12/12 1219

## 2012-08-12 NOTE — ED Notes (Signed)
Pt  Reports  Symptoms  Of  Chest /     Wall  /  Back pain x  2  Days   Pain  Started off in his  Back      Yesterday     Now  Has  Pain in  Chest         When he  Tales  A  Deep  Breath       And  Certain  Movements             He  Is  Awake  And  Alert  And  Oriented  His  Skin is  Warm /  Dry cap refill  Is  brisk

## 2012-08-12 NOTE — ED Provider Notes (Signed)
History     CSN: [id number]  Arrival date & time 08/12/12  1254   First MD Initiated Contact with Patient 08/12/12 1410      Chief Complaint  Patient presents with  . Chest Pain    (Consider location/radiation/quality/duration/timing/severity/associated sxs/prior treatment) HPI Comments: Presents emergency department with chief complaint of chest pain. Patient states that he was having back pain for the past 2 days, and then when he awoke this morning, he began having chest pain. He feels like the chest pain radiates to his back now. The pain is mostly located on the right side of the chest. Nothing makes his symptoms better or worse. He was seen in urgent care earlier today, and was sent to the emergency department to have further workup done. Currently, he states that he is chest pain-free. He denies any shortness of breath or diaphoresis. There is no exertional component with chest pain, nor is it reproducible. Cardiac risk factors are family history, hyperlipidemia, and hypertension. He denies any fever, chills, cough, nausea, or vomiting.  The history is provided by the patient. No language interpreter was used.    Past Medical History  Diagnosis Date  . Hypertension   . Gout   . Hyperlipidemia     Past Surgical History  Procedure Laterality Date  . Back surgery      Family History  Problem Relation Age of Onset  . Hypertension Mother   . Diabetes Mother   . Gout Mother   . Hypertension Father   . Heart failure Father   . Cancer Maternal Grandmother   . Cancer Maternal Grandfather     History  Substance Use Topics  . Smoking status: Never Smoker   . Smokeless tobacco: Never Used  . Alcohol Use: Yes     Comment: socially      Review of Systems  All other systems reviewed and are negative.    Allergies  Benazepril hcl  Home Medications   Current Outpatient Rx  Name  Route  Sig  Dispense  Refill  . allopurinol (ZYLOPRIM) 300 MG tablet   Oral   Take  300 mg by mouth daily.           Marland Kitchen amLODipine (NORVASC) 10 MG tablet   Oral   Take 10 mg by mouth daily.           . carvedilol (COREG) 25 MG tablet   Oral   Take 25 mg by mouth 2 (two) times daily with a meal.         . colchicine 0.6 MG tablet   Oral   Take 0.6 mg by mouth as needed.          . furosemide (LASIX) 40 MG tablet   Oral   Take 80 mg by mouth daily.          Marland Kitchen losartan (COZAAR) 100 MG tablet   Oral   Take 100 mg by mouth 2 (two) times daily.         . predniSONE (DELTASONE) 20 MG tablet      Take 3 tabs PO x 2 days followed by 2 tabs PO x 2 days followed by 1 tab PO x 2 days   12 tablet   0     BP 155/93  Pulse 55  Temp(Src) 99.3 F (37.4 C) (Oral)  Resp 16  SpO2 97%  Physical Exam  Nursing note and vitals reviewed. Constitutional: He is oriented to person, place, and time. He  appears well-developed and well-nourished.  HENT:  Head: Normocephalic and atraumatic.  Eyes: Conjunctivae and EOM are normal. Pupils are equal, round, and reactive to light. Right eye exhibits no discharge. Left eye exhibits no discharge. No scleral icterus.  Neck: Normal range of motion. Neck supple. No JVD present.  Cardiovascular: Normal rate, regular rhythm, normal heart sounds and intact distal pulses.  Exam reveals no gallop and no friction rub.   No murmur heard. Pulmonary/Chest: Effort normal and breath sounds normal. No respiratory distress. He has no wheezes. He has no rales. He exhibits no tenderness.  Abdominal: Soft. Bowel sounds are normal. He exhibits no distension and no mass. There is no tenderness. There is no rebound and no guarding.  Musculoskeletal: Normal range of motion. He exhibits no edema and no tenderness.  Neurological: He is alert and oriented to person, place, and time.  Skin: Skin is warm and dry.  Psychiatric: He has a normal mood and affect. His behavior is normal. Judgment and thought content normal.    ED Course  Procedures  (including critical care time)  Labs Reviewed  CBC - Abnormal; Notable for the following:    RBC 6.03 (*)    MCV 76.5 (*)    RDW 16.5 (*)    All other components within normal limits  BASIC METABOLIC PANEL - Abnormal; Notable for the following:    GFR calc non Af Amer 73 (*)    GFR calc Af Amer 85 (*)    All other components within normal limits  POCT I-STAT TROPONIN I   Dg Chest 2 View  08/12/2012  *RADIOLOGY REPORT*  Clinical Data: Chest pain.  CHEST - 2 VIEW  Comparison: Sep 22, 2004.  Findings: Cardiomediastinal silhouette appears normal.  No acute pulmonary disease is noted.  Bony thorax is intact.  IMPRESSION: No acute cardiopulmonary abnormality seen.   Original Report Authenticated By: Lupita Raider.,  M.D.    ED ECG REPORT  I personally interpreted this EKG   Date: 08/12/2012   Rate: 53  Rhythm: normal sinus rhythm  QRS Axis: normal  Intervals: normal  ST/T Wave abnormalities: normal  Conduction Disutrbances:none  Narrative Interpretation:   Old EKG Reviewed: unchanged    1. Chest pain       MDM  Patient with chest pain.  Seen by [facility].  Pain started in back 2 days ago, then this morning it was on the right side of the chest.  Pain not reproducible.  Not SOB or diaphoresis.  PERC negative.  EKG is unchanged.  Discussed with Dr. Rubin Payor, will order one more troponin at 1600 and discharge to home with PCP follow up if negative.    3:55 PM  Patient discussed with oncoming providers who will follow-up with troponin and dispo.          Roxy Horseman, PA-C 08/12/12 1556

## 2012-08-12 NOTE — ED Notes (Signed)
Pt reports waking up yesterday with pain in left shoulder and back. States today developed chest pain along right side as well as into back. Seen at urgent care this AM and sent for further management of atypical chest pain. EKG NSR. Pt denies SOB.

## 2012-08-14 NOTE — ED Provider Notes (Signed)
Medical screening examination/treatment/procedure(s) were performed by non-physician practitioner and as supervising physician I was immediately available for consultation/collaboration.  Brenetta Penny R. Dvon Jiles, MD 08/14/12 1450 

## 2012-08-29 ENCOUNTER — Ambulatory Visit: Payer: 59 | Admitting: Dietician

## 2015-11-10 ENCOUNTER — Emergency Department (HOSPITAL_COMMUNITY)
Admission: EM | Admit: 2015-11-10 | Discharge: 2015-11-11 | Disposition: A | Discharge status: Home / Self Care | Payer: BLUE CROSS/BLUE SHIELD | Attending: Emergency Medicine | Admitting: Emergency Medicine

## 2015-11-10 ENCOUNTER — Encounter (HOSPITAL_COMMUNITY): Payer: Self-pay | Admitting: Emergency Medicine

## 2015-11-10 DIAGNOSIS — I10 Essential (primary) hypertension: Secondary | ICD-10-CM | POA: Diagnosis not present

## 2015-11-10 DIAGNOSIS — R1013 Epigastric pain: Secondary | ICD-10-CM | POA: Diagnosis not present

## 2015-11-10 DIAGNOSIS — Z79899 Other long term (current) drug therapy: Secondary | ICD-10-CM | POA: Insufficient documentation

## 2015-11-10 DIAGNOSIS — R1011 Right upper quadrant pain: Secondary | ICD-10-CM | POA: Diagnosis present

## 2015-11-10 LAB — COMPREHENSIVE METABOLIC PANEL
ALK PHOS: 46 U/L (ref 38–126)
ALT: 23 U/L (ref 17–63)
ANION GAP: 9 (ref 5–15)
AST: 27 U/L (ref 15–41)
Albumin: 4.3 g/dL (ref 3.5–5.0)
BILIRUBIN TOTAL: 0.6 mg/dL (ref 0.3–1.2)
BUN: 14 mg/dL (ref 6–20)
CALCIUM: 9.8 mg/dL (ref 8.9–10.3)
CO2: 25 mmol/L (ref 22–32)
CREATININE: 1.3 mg/dL — AB (ref 0.61–1.24)
Chloride: 106 mmol/L (ref 101–111)
Glucose, Bld: 98 mg/dL (ref 65–99)
Potassium: 3.7 mmol/L (ref 3.5–5.1)
Sodium: 140 mmol/L (ref 135–145)
TOTAL PROTEIN: 7.3 g/dL (ref 6.5–8.1)

## 2015-11-10 LAB — URINE MICROSCOPIC-ADD ON: RBC / HPF: NONE SEEN RBC/hpf (ref 0–5)

## 2015-11-10 LAB — CBC
HCT: 47.4 % (ref 39.0–52.0)
Hemoglobin: 15.8 g/dL (ref 13.0–17.0)
MCH: 26.6 pg (ref 26.0–34.0)
MCHC: 33.3 g/dL (ref 30.0–36.0)
MCV: 79.7 fL (ref 78.0–100.0)
PLATELETS: 190 10*3/uL (ref 150–400)
RBC: 5.95 MIL/uL — AB (ref 4.22–5.81)
RDW: 15.6 % — AB (ref 11.5–15.5)
WBC: 5.9 10*3/uL (ref 4.0–10.5)

## 2015-11-10 LAB — URINALYSIS, ROUTINE W REFLEX MICROSCOPIC
GLUCOSE, UA: NEGATIVE mg/dL
HGB URINE DIPSTICK: NEGATIVE
KETONES UR: NEGATIVE mg/dL
Leukocytes, UA: NEGATIVE
Nitrite: NEGATIVE
PH: 5.5 (ref 5.0–8.0)
PROTEIN: 100 mg/dL — AB
Specific Gravity, Urine: 1.029 (ref 1.005–1.030)

## 2015-11-10 LAB — LIPASE, BLOOD: Lipase: 18 U/L (ref 11–51)

## 2015-11-10 MED ORDER — PANTOPRAZOLE SODIUM 40 MG PO TBEC
40.0000 mg | DELAYED_RELEASE_TABLET | Freq: Once | ORAL | Status: AC
  Administered 2015-11-10: 40 mg via ORAL
  Filled 2015-11-10: qty 1

## 2015-11-10 MED ORDER — GI COCKTAIL ~~LOC~~
30.0000 mL | Freq: Once | ORAL | Status: AC
  Administered 2015-11-10: 30 mL via ORAL
  Filled 2015-11-10: qty 30

## 2015-11-10 NOTE — ED Provider Notes (Signed)
CSN: [id number]     Arrival date & time 11/10/15  2031 History  By signing my name below, I, Martin [name], attest that this documentation has been prepared under the direction and in the presence of Martin Batonourtney F Romario Tith, MD . Electronically Signed: Freida Busmaniana [name], Scribe. 11/10/2015. 11:26 PM.  Chief Complaint  Patient presents with  . Abdominal Pain    The history is provided by the patient. No language interpreter was used.   HPI Comments:  Martin [name] is a 45 y.o. male with a history of HTN,  who presents to the Emergency Department complaining of intermittent upper abdominal pain x 2 days. He describes his pain as dull and achy and states his pain is exacerbated after eating or drinking. He reports h/o similar pain; states he was diagnosed with acid reflux. He denies pain at this time. He also denies nausea, vomiting, diarrhea, fever, CP, and SOB. Pt denies increased use of anti-inflammatories and  h/o cholecystectomy. Pt admits he drinks ETOH ~ 1-2 times a week. No alleviating factors noted.   Past Medical History  Diagnosis Date  . Hypertension   . Gout   . Hyperlipidemia    Past Surgical History  Procedure Laterality Date  . Back surgery     Family History  Problem Relation Age of Onset  . Hypertension Mother   . Diabetes Mother   . Gout Mother   . Hypertension Father   . Heart failure Father   . Cancer Maternal Grandmother   . Cancer Maternal Grandfather    Social History  Substance Use Topics  . Smoking status: Never Smoker   . Smokeless tobacco: Never Used  . Alcohol Use: Yes    Review of Systems  Constitutional: Negative for fever.  Respiratory: Negative for shortness of breath.   Cardiovascular: Negative for chest pain.  Gastrointestinal: Positive for abdominal pain. Negative for nausea, vomiting and diarrhea.  All other systems reviewed and are negative.  Allergies  Benazepril hcl  Home Medications   Prior to Admission medications   Medication Sig Start  Date End Date Taking? Authorizing Provider  acetaminophen (TYLENOL) 500 MG tablet Take 1,000 mg by mouth every 6 (six) hours as needed for pain.    Historical Provider, MD  allopurinol (ZYLOPRIM) 300 MG tablet Take 300 mg by mouth daily.      Historical Provider, MD  amLODipine (NORVASC) 10 MG tablet Take 10 mg by mouth daily.      Historical Provider, MD  calcium carbonate (TUMS - DOSED IN MG ELEMENTAL CALCIUM) 500 MG chewable tablet Chew 3 tablets by mouth daily as needed for heartburn.    Historical Provider, MD  carvedilol (COREG) 25 MG tablet Take 25 mg by mouth daily.     Historical Provider, MD  colchicine 0.6 MG tablet Take 0.6 mg by mouth as needed (gout).     Historical Provider, MD  furosemide (LASIX) 40 MG tablet Take 80 mg by mouth daily.     Historical Provider, MD  losartan (COZAAR) 100 MG tablet Take 100 mg by mouth daily.     Historical Provider, MD  omeprazole (PRILOSEC) 20 MG capsule Take 1 capsule (20 mg total) by mouth daily. 11/11/15   Martin Batonourtney F Cliffie Gingras, MD  OVER THE COUNTER MEDICATION Apply 1 application topically daily as needed (for fungus,  to feet).    Historical Provider, MD   BP 141/74 mmHg  Pulse 72  Temp(Src) 98.5 F (36.9 C) (Oral)  Resp 18  Ht 6\' 4"  (  1.93 m)  Wt 318 lb 6 oz (144.414 kg)  BMI 38.77 kg/m2  SpO2 98% Physical Exam  Constitutional: He is oriented to person, place, and time. He appears well-developed and well-nourished. No distress.  Obese  HENT:  Head: Normocephalic and atraumatic.  Cardiovascular: Normal rate, regular rhythm and normal heart sounds.   No murmur heard. Pulmonary/Chest: Effort normal and breath sounds normal. No respiratory distress. He has no wheezes.  Abdominal: Soft. Bowel sounds are normal. There is no tenderness. There is no rebound and no guarding.  Musculoskeletal: He exhibits no edema.  Neurological: He is alert and oriented to person, place, and time.  Skin: Skin is warm and dry.  Psychiatric: He has a normal mood  and affect.  Nursing note and vitals reviewed.   ED Course  Procedures   DIAGNOSTIC STUDIES:  Oxygen Saturation is 98% on RA, normal by my interpretation.    COORDINATION OF CARE:  11:24 PM Pt updated with results. Will order GI cocktail. Discussed treatment plan with pt at bedside and pt agreed to plan.  Labs Review Labs Reviewed  COMPREHENSIVE METABOLIC PANEL - Abnormal; Notable for the following:    Creatinine, Ser 1.30 (*)    All other components within normal limits  CBC - Abnormal; Notable for the following:    RBC 5.95 (*)    RDW 15.6 (*)    All other components within normal limits  URINALYSIS, ROUTINE W REFLEX MICROSCOPIC (NOT AT [facility]) - Abnormal; Notable for the following:    Bilirubin Urine SMALL (*)    Protein, ur 100 (*)    All other components within normal limits  URINE MICROSCOPIC-ADD ON - Abnormal; Notable for the following:    Squamous Epithelial / LPF 0-5 (*)    Bacteria, UA RARE (*)    Casts HYALINE CASTS (*)    All other components within normal limits  LIPASE, BLOOD    Imaging Review No results found. I have personally reviewed and evaluated these images and lab results as part of my medical decision-making.   EKG Interpretation   Date/Time:  Thursday November 10 2015 23:35:12 EDT Ventricular Rate:  49 PR Interval:    QRS Duration: 110 QT Interval:  439 QTC Calculation: 397 R Axis:   36 Text Interpretation:  Sinus bradycardia Borderline ST elevation, anterior  leads Wander in lead V2 No significant change since last tracing Confirmed  by Niyonna Betsill  MD, Toni AmendOURTNEY ([id number]) on 11/10/2015 11:38:10 PM      MDM   Final diagnoses:  Epigastric pain    Patient presents with upper abdominal pain. Onset yesterday. Worse with eating. History of GERD for similar symptoms. Currently pain free. Nontoxic on exam. Nontender in the abdomen. Considerations include GERD, pancreatitis, cholecystitis. However, given he is pain-free and nontender on exam, suspect  this may be patient's known GERD. Lab work including lipase and LFTs are reassuring.  Patient was given GI cocktail and Protonix. EKG reassuring. Discussed with patient presumptive treatment for GERD. Follow-up with GI.  After history, exam, and medical workup I feel the patient has been appropriately medically screened and is safe for discharge home. Pertinent diagnoses were discussed with the patient. Patient was given return precautions.  I personally performed the services described in this documentation, which was scribed in my presence. The recorded information has been reviewed and is accurate.     Martin Batonourtney F Theresa Dohrman, MD 11/11/15 Marlyne [email or web address]

## 2015-11-10 NOTE — ED Notes (Signed)
Pt. reports mid abdominal pain with nausea onset yesterday , denies emesis or diarrhea . No fever or chills.

## 2015-11-11 MED ORDER — OMEPRAZOLE 20 MG PO CPDR: 20.0000 mg | DELAYED_RELEASE_CAPSULE | Freq: Every day | ORAL | Status: DC

## 2015-11-11 NOTE — Discharge Instructions (Signed)

## 2016-01-18 ENCOUNTER — Ambulatory Visit (HOSPITAL_COMMUNITY)
Admission: EM | Admit: 2016-01-18 | Discharge: 2016-01-18 | Disposition: A | Discharge status: Home / Self Care | Payer: BLUE CROSS/BLUE SHIELD | Attending: Family Medicine | Admitting: Family Medicine

## 2016-01-18 ENCOUNTER — Ambulatory Visit (INDEPENDENT_AMBULATORY_CARE_PROVIDER_SITE_OTHER): Payer: BLUE CROSS/BLUE SHIELD

## 2016-01-18 ENCOUNTER — Encounter (HOSPITAL_COMMUNITY): Payer: Self-pay | Admitting: Emergency Medicine

## 2016-01-18 DIAGNOSIS — M67912 Unspecified disorder of synovium and tendon, left shoulder: Secondary | ICD-10-CM

## 2016-01-18 MED ORDER — MELOXICAM 15 MG PO TABS: 15.0000 mg | ORAL_TABLET | Freq: Every day | ORAL | 0 refills | Status: DC

## 2016-01-18 NOTE — ED Triage Notes (Signed)
Patient was playing basketball today.  Patient did get pushed twice in upper left arm  Patient reports pain started before stopping playing basketball.  Patient reports pain is worsening.  Pain is described as dull.  Patient says he cannot move left shoulder.  Patient touches/holds left chest and shoulder as location of pain. Patient is rocking back and .    Left radial pulse is 2 plus.  Arms are equally warm to touch.  Patient is right handed.

## 2016-01-18 NOTE — Discharge Instructions (Signed)
Follow up with your orthopedic doctor as soon as possible.   Do not do the following: Any work with the arms above shoulder level (especially lifting) until the pain has subsided. Sleep on the affected side.  Especially avoid sleeping with your arm under your head or your pillow.  This is a habit that is hard to break.   Do the following: Do the shoulder exercises we reviewed twice daily followed by ice for 10 minutes. If no better in 1 month, follow up with me or with an orthopedist. Use of over the counter pain meds can be of help.  Tylenol (or acetaminophen) is the safest to use.  It often helps to take this regularly.  You can take up to 325 mg tablets, 2 at a time, 5 times daily.

## 2016-01-18 NOTE — ED Provider Notes (Addendum)
[facility]    CSN: [id number] Arrival date & time: 01/18/16  1635  First Provider Contact:  First MD Initiated Contact with Patient 01/18/16 1712     History   Chief Complaint Chief Complaint  Patient presents with  . Shoulder Pain    HPI Martin [name] is a right hand-dominant 45 y.o. male with a history of HTN and HLD presenting with acute left shoulder pain. He noticed throbbing moderate pain on the lateral left shoulder radiating down the arm but not to the elbow and has had progressive difficulty and now severe pain when moving the shoulder since that time. He was playing basketball but denies trauma, injury. Ibuprofen 800mg  has not helped the pain. He denies numbness and tingling, neck pain. No history of dislocation/injury/surgery to that shoulder, but had an episode of intense left shoulder pain and numbness a year ago that resolved within 1 hour without intervention. No chest pain, dyspnea, diaphoresis, N/V.   He does not smoke, father had a heart attack in old age. No h/o DM.   HPI  Past Medical History:  Diagnosis Date  . Gout   . Hyperlipidemia   . Hypertension    Patient Active Problem List   Diagnosis Date Noted  . GERD 01/12/2007  . HYPERLIPIDEMIA, OTHER UNSPECIFIED 12/30/2006  . GOUT NOS 12/30/2006  . HYPERTENSION 12/30/2006  . BACK PAIN, LUMBAR 10/08/2006  . FATTY LIVER DISEASE, HX OF 02/22/2006   Past Surgical History:  Procedure Laterality Date  . BACK SURGERY     Home Medications    Prior to Admission medications   Medication Sig Start Date End Date Taking? Authorizing Provider  acetaminophen (TYLENOL) 500 MG tablet Take 1,000 mg by mouth every 6 (six) hours as needed for pain.    Historical Provider, MD  allopurinol (ZYLOPRIM) 300 MG tablet Take 300 mg by mouth daily.      Historical Provider, MD  amLODipine (NORVASC) 10 MG tablet Take 10 mg by mouth daily.      Historical Provider, MD  calcium carbonate (TUMS - DOSED IN MG ELEMENTAL  CALCIUM) 500 MG chewable tablet Chew 3 tablets by mouth daily as needed for heartburn.    Historical Provider, MD  carvedilol (COREG) 25 MG tablet Take 25 mg by mouth daily.     Historical Provider, MD  colchicine 0.6 MG tablet Take 0.6 mg by mouth as needed (gout).     Historical Provider, MD  furosemide (LASIX) 40 MG tablet Take 80 mg by mouth daily.     Historical Provider, MD  losartan (COZAAR) 100 MG tablet Take 100 mg by mouth daily.     Historical Provider, MD  meloxicam (MOBIC) 15 MG tablet Take 1 tablet (15 mg total) by mouth daily. 01/18/16   Tyrone Nineyan B Jackelyne Sayer, MD  omeprazole (PRILOSEC) 20 MG capsule Take 1 capsule (20 mg total) by mouth daily. 11/11/15   Shon Batonourtney F Horton, MD  OVER THE COUNTER MEDICATION Apply 1 application topically daily as needed (for fungus,  to feet).    Historical Provider, MD   Family History Family History  Problem Relation Age of Onset  . Hypertension Mother   . Diabetes Mother   . Gout Mother   . Hypertension Father   . Heart failure Father   . Cancer Maternal Grandmother   . Cancer Maternal Grandfather    Social History Social History  Substance Use Topics  . Smoking status: Never Smoker  . Smokeless tobacco: Never Used  . Alcohol  use Yes   Allergies   Benazepril hcl  Review of Systems Review of Systems Per HPI  Physical Exam Triage Vital Signs ED Triage Vitals  Enc Vitals Group     BP 01/18/16 1701 153/86     Pulse Rate 01/18/16 1701 83     Resp 01/18/16 1701 22     Temp 01/18/16 1701 98.6 F (37 C)     Temp Source 01/18/16 1701 Oral     SpO2 01/18/16 1701 98 %     Weight --      Height --      Head Circumference --      Peak Flow --      Pain Score 01/18/16 1659 10     Pain Loc --      Pain Edu? --      Excl. in GC? --    No data found.   Updated Vital Signs BP 153/86   Pulse 83   Temp 98.6 F (37 C) (Oral)   Resp 22   SpO2 98%   Physical Exam  Constitutional: He is oriented to person, place, and time. He appears  well-developed and well-nourished.  leaning forward holding left forearm in right hand in evident pain.  Neck: Normal range of motion. Neck supple.  negative spurling's  Cardiovascular: Normal rate, regular rhythm, normal heart sounds and intact distal pulses.  Exam reveals no gallop.   No murmur heard. Pulmonary/Chest: Effort normal and breath sounds normal. No respiratory distress. He has no wheezes. He has no rales. He exhibits no tenderness.  Abdominal: Soft. Bowel sounds are normal.  Musculoskeletal:  Left shoulder: Inspection reveals no deformities, malalignment, atrophy or asymmetry. Palpation is completely normal with no tenderness elicited, with attention paid to [facility] joint and bicipital groove. AROM severely diminished, pain with any motion at all. Able to, with apprehension, allow me to raise the left shoulder in full abduction and flexion. Rotator cuff testing limited by pain.  Neurological: He is alert and oriented to person, place, and time.  Skin: Skin is warm and dry. Capillary refill takes less than 2 seconds.   UC Treatments / Results  Labs (all labs ordered are listed, but only abnormal results are displayed) Labs Reviewed - No data to display  EKG  EKG Interpretation None      Radiology Dg Shoulder Left  Result Date: 01/18/2016 CLINICAL DATA:  Left shoulder pain while plain basketball EXAM: LEFT SHOULDER - 2+ VIEW COMPARISON:  None. FINDINGS: There is no evidence of fracture or dislocation. Calcifications within the rotator cuff tendon noted. There is no evidence of arthropathy or other focal bone abnormality. Soft tissues are unremarkable. IMPRESSION: 1. No acute findings. 2. Suspect calcific tendinopathy of the rotator cuff tendon. Electronically Signed   By: Signa Kell M.D.   On: 01/18/2016 18:13    Procedures Procedures (including critical care time)  Medications Ordered in UC Medications - No data to display  Initial Impression / Assessment and Plan / UC  Course  I have reviewed the triage vital signs and the nursing notes.  Pertinent labs & imaging results that were available during my care of the patient were reviewed by me and considered in my medical decision making (see chart for details).  Final Clinical Impressions(s) / UC Diagnoses   Final diagnoses:  Tendinopathy of left rotator cuff   45 y.o. male with acute left shoulder pain without trauma. Severely diminished AROM with normal PROM. XR without dislocation or bony lesions but  evidence of chronic rotator cuff tendinopathy, so suspect this was irritated while playing basketball today.  - He will call [facility], where he has been seen before, in the AM for an appointment. - Rest but continue ROM exercises (provided and demonstrated) - NSAIDs - Follow up with PCP with consideration for MRI/referral to ortho.   New Prescriptions New Prescriptions   MELOXICAM (MOBIC) 15 MG TABLET    Take 1 tablet (15 mg total) by mouth daily.     Tyrone Nine, MD 01/18/16 1843    Tyrone Nine, MD 01/18/16 [phone]

## 2016-01-26 ENCOUNTER — Other Ambulatory Visit: Payer: Self-pay | Admitting: Family Medicine

## 2016-01-26 DIAGNOSIS — R109 Unspecified abdominal pain: Secondary | ICD-10-CM

## 2016-02-03 ENCOUNTER — Ambulatory Visit
Admission: RE | Admit: 2016-02-03 | Discharge: 2016-02-03 | Disposition: A | Discharge status: Home / Self Care | Payer: BLUE CROSS/BLUE SHIELD | Source: Ambulatory Visit | Attending: Family Medicine | Admitting: Family Medicine

## 2016-02-03 DIAGNOSIS — R109 Unspecified abdominal pain: Secondary | ICD-10-CM

## 2016-07-03 ENCOUNTER — Other Ambulatory Visit (HOSPITAL_COMMUNITY)
Admission: RE | Admit: 2016-07-03 | Discharge: 2016-07-03 | Disposition: A | Discharge status: Home / Self Care | Payer: BLUE CROSS/BLUE SHIELD | Source: Ambulatory Visit | Attending: Family Medicine | Admitting: Family Medicine

## 2016-07-03 ENCOUNTER — Other Ambulatory Visit: Payer: Self-pay | Admitting: Family Medicine

## 2016-07-03 DIAGNOSIS — Z113 Encounter for screening for infections with a predominantly sexual mode of transmission: Secondary | ICD-10-CM | POA: Insufficient documentation

## 2016-07-09 LAB — URINE CYTOLOGY ANCILLARY ONLY
Bacterial vaginitis: NEGATIVE
CANDIDA VAGINITIS: NEGATIVE
CHLAMYDIA, DNA PROBE: NEGATIVE
Neisseria Gonorrhea: NEGATIVE
TRICH (WINDOWPATH): NEGATIVE

## 2018-09-25 ENCOUNTER — Other Ambulatory Visit: Payer: Self-pay

## 2018-11-15 ENCOUNTER — Inpatient Hospital Stay (HOSPITAL_COMMUNITY)
Admission: EM | Admit: 2018-11-15 | Discharge: 2018-11-18 | DRG: 177 | Disposition: A | Discharge status: Home / Self Care | Payer: Managed Care, Other (non HMO) | Attending: Internal Medicine | Admitting: Internal Medicine

## 2018-11-15 ENCOUNTER — Other Ambulatory Visit: Payer: Self-pay

## 2018-11-15 ENCOUNTER — Emergency Department (HOSPITAL_COMMUNITY): Payer: Managed Care, Other (non HMO)

## 2018-11-15 ENCOUNTER — Encounter (HOSPITAL_COMMUNITY): Payer: Self-pay | Admitting: Emergency Medicine

## 2018-11-15 DIAGNOSIS — K760 Fatty (change of) liver, not elsewhere classified: Secondary | ICD-10-CM | POA: Diagnosis present

## 2018-11-15 DIAGNOSIS — K219 Gastro-esophageal reflux disease without esophagitis: Secondary | ICD-10-CM | POA: Diagnosis present

## 2018-11-15 DIAGNOSIS — I129 Hypertensive chronic kidney disease with stage 1 through stage 4 chronic kidney disease, or unspecified chronic kidney disease: Secondary | ICD-10-CM | POA: Diagnosis present

## 2018-11-15 DIAGNOSIS — Z79899 Other long term (current) drug therapy: Secondary | ICD-10-CM | POA: Diagnosis not present

## 2018-11-15 DIAGNOSIS — Z6841 Body Mass Index (BMI) 40.0 and over, adult: Secondary | ICD-10-CM | POA: Diagnosis not present

## 2018-11-15 DIAGNOSIS — Z888 Allergy status to other drugs, medicaments and biological substances status: Secondary | ICD-10-CM

## 2018-11-15 DIAGNOSIS — I10 Essential (primary) hypertension: Secondary | ICD-10-CM | POA: Diagnosis present

## 2018-11-15 DIAGNOSIS — J1289 Other viral pneumonia: Secondary | ICD-10-CM | POA: Diagnosis present

## 2018-11-15 DIAGNOSIS — N189 Chronic kidney disease, unspecified: Secondary | ICD-10-CM | POA: Diagnosis present

## 2018-11-15 DIAGNOSIS — I959 Hypotension, unspecified: Secondary | ICD-10-CM | POA: Diagnosis not present

## 2018-11-15 DIAGNOSIS — Y92230 Patient room in hospital as the place of occurrence of the external cause: Secondary | ICD-10-CM | POA: Diagnosis not present

## 2018-11-15 DIAGNOSIS — E785 Hyperlipidemia, unspecified: Secondary | ICD-10-CM | POA: Diagnosis present

## 2018-11-15 DIAGNOSIS — N179 Acute kidney failure, unspecified: Secondary | ICD-10-CM | POA: Diagnosis present

## 2018-11-15 DIAGNOSIS — Z8249 Family history of ischemic heart disease and other diseases of the circulatory system: Secondary | ICD-10-CM

## 2018-11-15 DIAGNOSIS — J1282 Pneumonia due to coronavirus disease 2019: Secondary | ICD-10-CM

## 2018-11-15 DIAGNOSIS — U071 COVID-19: Principal | ICD-10-CM | POA: Diagnosis present

## 2018-11-15 DIAGNOSIS — T380X5A Adverse effect of glucocorticoids and synthetic analogues, initial encounter: Secondary | ICD-10-CM | POA: Diagnosis not present

## 2018-11-15 DIAGNOSIS — D72829 Elevated white blood cell count, unspecified: Secondary | ICD-10-CM | POA: Diagnosis not present

## 2018-11-15 DIAGNOSIS — Z791 Long term (current) use of non-steroidal anti-inflammatories (NSAID): Secondary | ICD-10-CM

## 2018-11-15 DIAGNOSIS — M109 Gout, unspecified: Secondary | ICD-10-CM | POA: Diagnosis present

## 2018-11-15 HISTORY — DX: Disorder of kidney and ureter, unspecified: N28.9

## 2018-11-15 LAB — CBC WITH DIFFERENTIAL/PLATELET
Abs Immature Granulocytes: 0.03 10*3/uL (ref 0.00–0.07)
Basophils Absolute: 0 10*3/uL (ref 0.0–0.1)
Basophils Relative: 0 %
Eosinophils Absolute: 0 10*3/uL (ref 0.0–0.5)
Eosinophils Relative: 0 %
HCT: 41.7 % (ref 39.0–52.0)
Hemoglobin: 13.7 g/dL (ref 13.0–17.0)
Immature Granulocytes: 0 %
Lymphocytes Relative: 7 %
Lymphs Abs: 0.6 10*3/uL — ABNORMAL LOW (ref 0.7–4.0)
MCH: 26.3 pg (ref 26.0–34.0)
MCHC: 32.9 g/dL (ref 30.0–36.0)
MCV: 80 fL (ref 80.0–100.0)
Monocytes Absolute: 0.6 10*3/uL (ref 0.1–1.0)
Monocytes Relative: 7 %
Neutro Abs: 7.1 10*3/uL (ref 1.7–7.7)
Neutrophils Relative %: 86 %
Platelets: 226 10*3/uL (ref 150–400)
RBC: 5.21 MIL/uL (ref 4.22–5.81)
RDW: 15.7 % — ABNORMAL HIGH (ref 11.5–15.5)
WBC: 8.4 10*3/uL (ref 4.0–10.5)
nRBC: 0 % (ref 0.0–0.2)

## 2018-11-15 LAB — COMPREHENSIVE METABOLIC PANEL
ALT: 62 U/L — ABNORMAL HIGH (ref 0–44)
AST: 60 U/L — ABNORMAL HIGH (ref 15–41)
Albumin: 4.5 g/dL (ref 3.5–5.0)
Alkaline Phosphatase: 52 U/L (ref 38–126)
Anion gap: 14 (ref 5–15)
BUN: 30 mg/dL — ABNORMAL HIGH (ref 6–20)
CO2: 25 mmol/L (ref 22–32)
Calcium: 9 mg/dL (ref 8.9–10.3)
Chloride: 96 mmol/L — ABNORMAL LOW (ref 98–111)
Creatinine, Ser: 1.99 mg/dL — ABNORMAL HIGH (ref 0.61–1.24)
GFR calc Af Amer: 45 mL/min — ABNORMAL LOW (ref >60)
GFR calc non Af Amer: 39 mL/min — ABNORMAL LOW (ref >60)
Glucose, Bld: 111 mg/dL — ABNORMAL HIGH (ref 70–99)
Potassium: 3.4 mmol/L — ABNORMAL LOW (ref 3.5–5.1)
Sodium: 135 mmol/L (ref 135–145)
Total Bilirubin: 0.8 mg/dL (ref 0.3–1.2)
Total Protein: 8.4 g/dL — ABNORMAL HIGH (ref 6.5–8.1)

## 2018-11-15 LAB — FERRITIN: Ferritin: 1056 ng/mL — ABNORMAL HIGH (ref 24–336)

## 2018-11-15 LAB — LIPASE, BLOOD: Lipase: 31 U/L (ref 11–51)

## 2018-11-15 LAB — TRIGLYCERIDES: Triglycerides: 105 mg/dL (ref <150)

## 2018-11-15 LAB — LACTIC ACID, PLASMA
Lactic Acid, Venous: 0.9 mmol/L (ref 0.5–1.9)
Lactic Acid, Venous: 0.8 mmol/L (ref 0.5–1.9)

## 2018-11-15 LAB — SEDIMENTATION RATE: Sed Rate: 27 mm/hr — ABNORMAL HIGH (ref 0–16)

## 2018-11-15 LAB — LACTATE DEHYDROGENASE: LDH: 316 U/L — ABNORMAL HIGH (ref 98–192)

## 2018-11-15 LAB — FIBRINOGEN: Fibrinogen: 665 mg/dL — ABNORMAL HIGH (ref 210–475)

## 2018-11-15 LAB — D-DIMER, QUANTITATIVE: D-Dimer, Quant: 0.5 ug/mL-FEU (ref 0.00–0.50)

## 2018-11-15 LAB — C-REACTIVE PROTEIN: CRP: 11.7 mg/dL — ABNORMAL HIGH (ref <1.0)

## 2018-11-15 LAB — SARS CORONAVIRUS 2 BY RT PCR (HOSPITAL ORDER, PERFORMED IN ~~LOC~~ HOSPITAL LAB): SARS Coronavirus 2: POSITIVE — AB

## 2018-11-15 LAB — ABO/RH: ABO/RH(D): O POS

## 2018-11-15 LAB — PROCALCITONIN: Procalcitonin: 0.14 ng/mL

## 2018-11-15 MED ORDER — DEXAMETHASONE SODIUM PHOSPHATE 10 MG/ML IJ SOLN
10.0000 mg | Freq: Once | INTRAMUSCULAR | Status: AC
  Administered 2018-11-15: 13:00:00 10 mg via INTRAVENOUS
  Filled 2018-11-15: qty 1

## 2018-11-15 MED ORDER — ROSUVASTATIN CALCIUM 10 MG PO TABS
10.0000 mg | ORAL_TABLET | Freq: Every day | ORAL | Status: DC
  Administered 2018-11-16 – 2018-11-18 (×3): 10 mg via ORAL
  Filled 2018-11-15 (×4): qty 1

## 2018-11-15 MED ORDER — AMLODIPINE BESYLATE 5 MG PO TABS
10.0000 mg | ORAL_TABLET | Freq: Every day | ORAL | Status: DC
  Administered 2018-11-16 – 2018-11-18 (×3): 10 mg via ORAL
  Filled 2018-11-15 (×3): qty 2

## 2018-11-15 MED ORDER — SODIUM CHLORIDE 0.9 % IV SOLN
500.0000 mg | INTRAVENOUS | Status: DC
  Administered 2018-11-15 – 2018-11-17 (×3): 500 mg via INTRAVENOUS
  Filled 2018-11-15 (×3): qty 500

## 2018-11-15 MED ORDER — SODIUM CHLORIDE 0.9 % IV SOLN
1.0000 g | INTRAVENOUS | Status: DC
  Administered 2018-11-15 – 2018-11-17 (×3): 1 g via INTRAVENOUS
  Filled 2018-11-15 (×3): qty 10

## 2018-11-15 MED ORDER — CARVEDILOL 25 MG PO TABS
25.0000 mg | ORAL_TABLET | Freq: Every day | ORAL | Status: DC
  Filled 2018-11-15: qty 1

## 2018-11-15 MED ORDER — ACETAMINOPHEN 500 MG PO TABS
1000.0000 mg | ORAL_TABLET | Freq: Once | ORAL | Status: AC
  Administered 2018-11-15: 12:00:00 1000 mg via ORAL
  Filled 2018-11-15: qty 2

## 2018-11-15 MED ORDER — METHYLPREDNISOLONE SODIUM SUCC 125 MG IJ SOLR
60.0000 mg | Freq: Three times a day (TID) | INTRAMUSCULAR | Status: DC
  Administered 2018-11-15 – 2018-11-18 (×8): 60 mg via INTRAVENOUS
  Filled 2018-11-15 (×8): qty 2

## 2018-11-15 MED ORDER — SODIUM CHLORIDE 0.9 % IV BOLUS
1000.0000 mL | Freq: Once | INTRAVENOUS | Status: AC
  Administered 2018-11-15: 12:00:00 1000 mL via INTRAVENOUS

## 2018-11-15 NOTE — H&P (Signed)
History and Physical  Martin [name] HKF:[id number] DOB: [birth year] DOA: 11/15/2018   Patient coming from: Home & is able to ambulate  Chief Complaint: Fever, cough, malaise  HPI: Martin [name] is a 48 y.o. male with medical history significant for hypertension, hyperlipidemia, gout, presents to the ED complaining of fever, some productive cough of greenish/whitish sputum, malaise, and some subjective shortness of breath that has been ongoing for the past 11 days.  Patient had tried home remedies without any improvement.  About 2 days ago he went to an urgent care where he tested positive for COVID-19.  Patient denies any sick contacts, lives with his wife and kids, and none of them are sick.  Patient continues to also get progressively weak.  Patient denies any chest pain, abdominal pain, nausea/vomiting, diarrhea.  ED Course: Patient noted to be febrile, with no leukocytosis, saturating well on room air.  Inflammatory markers elevated, creatinine elevated at 1.99, chest x-ray showed ill-defined opacity in the left midlung consistent with pneumonia.  Patient was given IV fluids.  [facility] hospitalist called to admit patient for [place]  Review of Systems: Review of systems are otherwise negative   Past Medical History:  Diagnosis Date  . Gout   . Hyperlipidemia   . Hypertension   . Renal disorder    Past Surgical History:  Procedure Laterality Date  . BACK SURGERY      Social History:  reports that he has never smoked. He has never used smokeless tobacco. He reports current alcohol use. He reports that he does not use drugs.   Allergies  Allergen Reactions  . Benazepril Hcl Swelling    Swelling of the lips    Family History  Problem Relation Age of Onset  . Hypertension Mother   . Diabetes Mother   . Gout Mother   . Hypertension Father   . Heart failure Father   . Cancer Maternal Grandmother   . Cancer Maternal Grandfather       Prior to Admission medications   Medication  Sig Start Date End Date Taking? Authorizing Provider  acetaminophen (TYLENOL) 500 MG tablet Take 1,000 mg by mouth every 6 (six) hours as needed for pain.   Yes [provider]  amLODipine (NORVASC) 10 MG tablet Take 10 mg by mouth daily.     Yes [provider]  carvedilol (COREG) 25 MG tablet Take 25 mg by mouth daily.    Yes [provider]  chlorthalidone (HYGROTON) 25 MG tablet Take 25 mg by mouth daily. 09/28/18  Yes [provider]  clotrimazole-betamethasone (LOTRISONE) cream Apply 1 application topically daily as needed. 10/22/18  Yes [provider]  furosemide (LASIX) 40 MG tablet Take 80 mg by mouth daily.    Yes [provider]  losartan (COZAAR) 100 MG tablet Take 100 mg by mouth daily.    Yes [provider]  rosuvastatin (CRESTOR) 10 MG tablet Take 10 mg by mouth daily. 09/28/18  Yes [provider]  spironolactone (ALDACTONE) 25 MG tablet Take 25 mg by mouth daily. 07/01/18  Yes [provider]  meloxicam (MOBIC) 15 MG tablet Take 1 tablet (15 mg total) by mouth daily. Patient not taking: Reported on 11/15/2018 01/18/16   Patrecia Pour, MD  omeprazole (PRILOSEC) 20 MG capsule Take 1 capsule (20 mg total) by mouth daily. Patient not taking: Reported on 11/15/2018 11/11/15   Merryl Hacker, MD    Physical Exam: BP 127/84 (BP Location: Right Arm)  Pulse 75   Temp 98.7 F (37.1 C) (Oral)   Resp 18   Ht _0  (1.93 m)   Wt (!) 161 kg   SpO2 94%   BMI 43.21 kg/m   General: In mild distress due to cough and subjective shortness of breath Eyes: Normal ENT: Normal Neck: Supple Cardiovascular: S1, S2 present Respiratory: Diminished breath sounds bilaterally Abdomen: Soft, nontender, nondistended, bowel sounds present Skin: Normal Musculoskeletal: No pedal edema bilaterally Psychiatric: Normal mood Neurologic: No focal neurologic deficit noted          Labs on Admission:  Basic Metabolic  Panel: Recent Labs  Lab 11/15/18 1134  NA 135  K 3.4*  CL 96*  CO2 25  GLUCOSE 111*  BUN 30*  CREATININE 1.99*  CALCIUM 9.0   Liver Function Tests: Recent Labs  Lab 11/15/18 1134  AST 60*  ALT 62*  ALKPHOS 52  BILITOT 0.8  PROT 8.4*  ALBUMIN 4.5   Recent Labs  Lab 11/15/18 1134  LIPASE 31   No results for input(s): AMMONIA in the last 168 hours. CBC: Recent Labs  Lab 11/15/18 1134  WBC 8.4  NEUTROABS 7.1  HGB 13.7  HCT 41.7  MCV 80.0  PLT 226   Cardiac Enzymes: No results for input(s): CKTOTAL, CKMB, CKMBINDEX, TROPONINI in the last 168 hours.  BNP (last 3 results) No results for input(s): BNP in the last 8760 hours.  ProBNP (last 3 results) No results for input(s): PROBNP in the last 8760 hours.  CBG: No results for input(s): GLUCAP in the last 168 hours.  Radiological Exams on Admission: Dg Chest Port 1 View  Result Date: 11/15/2018 CLINICAL DATA:  Shortness of breath.  Positive COVID-19 EXAM: PORTABLE CHEST 1 VIEW COMPARISON:  August 12, 2012 FINDINGS: There is ill-defined opacity in the left mid lung. Lungs elsewhere clear. Heart is borderline enlarged with pulmonary vascularity normal. No adenopathy. No bone lesions. IMPRESSION: Ill-defined airspace opacity left mid lung consistent with pneumonia. Lungs elsewhere clear. Heart prominent period no adenopathy evident. Electronically Signed   By: Lowella Grip III M.D.   On: 11/15/2018 12:15    EKG: Pending  Assessment/Plan Present on Admission: . COVID-19 virus infection . Essential hypertension  Principal Problem:   COVID-19 virus infection Active Problems:   Essential hypertension   Pneumonia due to COVID-19 virus Febrile, no leukocytosis Ferritin 1,056, CRP 11.7, ESR 27, d-dimer 0.50, procalcitonin 0.14, lactic acid 0.9 Chest x-ray showed ill-defined opacity in the left midlung consistent with pneumonia. Gave 1 dose of Decadron, continue Solu-Medrol 60 mg every 8 hours Started IV  ceftriaxone, azithromycin Transfer to [facility] as discussed with accepting physician Dr. Auburn Bilberry Supplemental oxygen PRN, inhalers  ?AKI Creatinine 1.99 Status post IV fluids Daily BMP  Hypertension Stable Held his chlorthalidone, Lasix, losartan, spironolactone due to AKI Continue amlodipine, Coreg  Hyperlipidemia Continue Crestor  Morbid obesity Lifestyle modification advised     DVT prophylaxis: Heparin  Code Status: Full  Family Communication: None at bedside  Disposition Plan: To be determined  Consults called: None  Admission status: Inpatient    Alma Friendly MD [facility]   If 7PM-7AM, please contact night-coverage [email or web address]   11/15/2018, 6:42 PM

## 2018-11-15 NOTE — ED Provider Notes (Signed)
[facility]-EMERGENCY DEPT Provider Note   CSN: [id number] Arrival date & time: 11/15/18  1051    History   Chief Complaint Chief Complaint  Patient presents with  . Shortness of Breath  . Fever    HPI Martin [name] is a 48 y.o. male.     HPI Patient with known coronavirus infection presents with dyspnea, fever, fatigue, diffuse myalgia. Patient has been symptomatic for approximately 11 days, with cough, dyspnea, generalized discomfort. After 3 days he went to urgent care, was tested, which had a positive result. He notes that since that time he has been at home, trying to drink plenty fluids, take Tylenol, ibuprofen, and rest. However, he has a fever that does not respond any longer, has worsening diffuse soreness and weakness, without focal pain. No confusion, disorientation, no vomiting. With progressive weakness he presents for evaluation. Past Medical History:  Diagnosis Date  . Gout   . Hyperlipidemia   . Hypertension   . Renal disorder     Patient Active Problem List   Diagnosis Date Noted  . GERD 01/12/2007  . HYPERLIPIDEMIA, OTHER UNSPECIFIED 12/30/2006  . GOUT NOS 12/30/2006  . HYPERTENSION 12/30/2006  . BACK PAIN, LUMBAR 10/08/2006  . FATTY LIVER DISEASE, HX OF 02/22/2006    Past Surgical History:  Procedure Laterality Date  . BACK SURGERY          Home Medications    Prior to Admission medications   Medication Sig Start Date End Date Taking? Authorizing Provider  acetaminophen (TYLENOL) 500 MG tablet Take 1,000 mg by mouth every 6 (six) hours as needed for pain.   Yes [provider]  amLODipine (NORVASC) 10 MG tablet Take 10 mg by mouth daily.     Yes [provider]  carvedilol (COREG) 25 MG tablet Take 25 mg by mouth daily.    Yes [provider]  chlorthalidone (HYGROTON) 25 MG tablet Take 25 mg by mouth daily. 09/28/18  Yes [provider]  clotrimazole-betamethasone (LOTRISONE)  cream Apply 1 application topically daily as needed. 10/22/18  Yes [provider]  furosemide (LASIX) 40 MG tablet Take 80 mg by mouth daily.    Yes [provider]  losartan (COZAAR) 100 MG tablet Take 100 mg by mouth daily.    Yes [provider]  rosuvastatin (CRESTOR) 10 MG tablet Take 10 mg by mouth daily. 09/28/18  Yes [provider]  spironolactone (ALDACTONE) 25 MG tablet Take 25 mg by mouth daily. 07/01/18  Yes [provider]  meloxicam (MOBIC) 15 MG tablet Take 1 tablet (15 mg total) by mouth daily. Patient not taking: Reported on 11/15/2018 01/18/16   Tyrone NineGrunz, Ryan B, MD  omeprazole (PRILOSEC) 20 MG capsule Take 1 capsule (20 mg total) by mouth daily. Patient not taking: Reported on 11/15/2018 11/11/15   Horton, Mayer Maskerourtney F, MD    Family History Family History  Problem Relation Age of Onset  . Hypertension Mother   . Diabetes Mother   . Gout Mother   . Hypertension Father   . Heart failure Father   . Cancer Maternal Grandmother   . Cancer Maternal Grandfather     Social History Social History   Tobacco Use  . Smoking status: Never Smoker  . Smokeless tobacco: Never Used  Substance Use Topics  . Alcohol use: Yes  . Drug use: No     Allergies   Benazepril hcl   Review of Systems Review of Systems  Constitutional:  Per HPI, otherwise negative  HENT:       Per HPI, otherwise negative  Respiratory:       Per HPI, otherwise negative  Cardiovascular:       Per HPI, otherwise negative  Gastrointestinal: Positive for nausea. Negative for vomiting.  Endocrine:       Negative aside from HPI  Genitourinary:       Neg aside from HPI   Musculoskeletal:       Per HPI, otherwise negative  Skin: Negative.   Allergic/Immunologic: Negative for immunocompromised state.  Neurological: Positive for weakness. Negative for syncope.     Physical Exam Updated Vital Signs BP 137/66 (BP Location: Left Arm)   Pulse 83   Temp  (!) 101 F (38.3 C) (Oral)   Resp 12   Ht 6\' 4"  (1.93 m)   Wt (!) 161 kg   SpO2 96%   BMI 43.21 kg/m   Physical Exam Vitals signs and nursing note reviewed.  Constitutional:      Appearance: He is well-developed. He is ill-appearing and diaphoretic.     Comments: Uncomfortable appearing adult male awake and alert  HENT:     Head: Normocephalic and atraumatic.  Eyes:     Conjunctiva/sclera: Conjunctivae normal.  Cardiovascular:     Rate and Rhythm: Regular rhythm. Tachycardia present.  Pulmonary:     Effort: Tachypnea present.     Breath sounds: Decreased breath sounds present. No wheezing.  Abdominal:     General: There is no distension.     Palpations: Abdomen is soft.     Tenderness: There is no abdominal tenderness. There is no guarding.  Skin:    General: Skin is warm.  Neurological:     Mental Status: He is alert and oriented to person, place, and time.      ED Treatments / Results  Labs (all labs ordered are listed, but only abnormal results are displayed) Labs Reviewed  COMPREHENSIVE METABOLIC PANEL - Abnormal; Notable for the following components:      Result Value   Potassium 3.4 (*)    Chloride 96 (*)    Glucose, Bld 111 (*)    BUN 30 (*)    Creatinine, Ser 1.99 (*)    Total Protein 8.4 (*)    AST 60 (*)    ALT 62 (*)    GFR calc non Af Amer 39 (*)    GFR calc Af Amer 45 (*)    All other components within normal limits  CBC WITH DIFFERENTIAL/PLATELET - Abnormal; Notable for the following components:   RDW 15.7 (*)    Lymphs Abs 0.6 (*)    All other components within normal limits  SARS CORONAVIRUS 2 (HOSPITAL ORDER, [facility])  LIPASE, BLOOD  LACTIC ACID, PLASMA  LACTIC ACID, PLASMA  HIV ANTIBODY (ROUTINE TESTING W REFLEX)  C-REACTIVE PROTEIN  D-DIMER, QUANTITATIVE (NOT AT [facility])  FERRITIN  FIBRINOGEN  GLUCOSE 6 PHOSPHATE DEHYDROGENASE  HEPATITIS B SURFACE ANTIGEN  INTERLEUKIN-6, PLASMA  LACTATE DEHYDROGENASE   PROCALCITONIN  SEDIMENTATION RATE  TRIGLYCERIDES  ABO/RH    EKG None  Radiology Dg Chest Port 1 View  Result Date: 11/15/2018 CLINICAL DATA:  Shortness of breath.  Positive COVID-19 EXAM: PORTABLE CHEST 1 VIEW COMPARISON:  August 12, 2012 FINDINGS: There is ill-defined opacity in the left mid lung. Lungs elsewhere clear. Heart is borderline enlarged with pulmonary vascularity normal. No adenopathy. No bone lesions. IMPRESSION: Ill-defined airspace opacity left mid lung consistent with  pneumonia. Lungs elsewhere clear. Heart prominent period no adenopathy evident. Electronically Signed   By: Bretta BangWilliam  Woodruff III M.D.   On: 11/15/2018 12:15    Procedures Procedures (including critical care time)  Medications Ordered in ED Medications  sodium chloride 0.9 % bolus 1,000 mL (1,000 mLs Intravenous New Bag/Given 11/15/18 1148)  acetaminophen (TYLENOL) tablet 1,000 mg (1,000 mg Oral Given 11/15/18 1148)  dexamethasone (DECADRON) injection 10 mg (10 mg Intravenous Given 11/15/18 1309)     Initial Impression / Assessment and Plan / ED Course  I have reviewed the triage vital signs and the nursing notes.  Pertinent labs & imaging results that were available during my care of the patient were reviewed by me and considered in my medical decision making (see chart for details).        1:16 PM On repeat exam the patient is in similar condition, though with slight improvement in comfort level. He remains tachycardic, fever has diminished somewhat He is aware of need for admission given concern for persistent tachypnea, tachycardia, acute kidney injury. He has begun to receive fluid resuscitation, initial meds also will be provided, broad labs for candidacy for coronavirus therapy also ordered.  Martin [name] was evaluated in Emergency Department on 11/15/2018 for the symptoms described in the history of present illness. He was evaluated in the context of the global COVID-19 pandemic, which  necessitated consideration that the patient might be at risk for infection with the SARS-CoV-2 virus that causes COVID-19. Institutional protocols and algorithms that pertain to the evaluation of patients at risk for COVID-19 are in a state of rapid change based on information released by regulatory bodies including the CDC and federal and state organizations. These policies and algorithms were followed during the patient's care in the ED.  Final Clinical Impressions(s) / ED Diagnoses   Final diagnoses:  Pneumonia due to COVID-19 virus     Gerhard MunchLockwood, Jullian Previti, MD 11/15/18 1317

## 2018-11-15 NOTE — ED Notes (Signed)
[facility] contacted and paperwork printed  

## 2018-11-15 NOTE — ED Notes (Signed)
ED TO INPATIENT HANDOFF REPORT  Name/Age/Gender Martin [name] 48 y.o. male  Code Status   Home/SNF/Other Home  Chief Complaint covid+  Level of Care/Admitting Diagnosis ED Disposition    ED Disposition Condition Comment   Admit  Hospital [facility]: [facility] [[id number]]  Level of Care: Med-Surg [16]  Covid Evaluation: Confirmed COVID Positive  Diagnosis: COVID-19 virus infection [[id number]]  Admitting Physician: Briant Cedar [[id number]]  Attending Physician: Briant Cedar [[id number]]  Estimated length of stay: past midnight tomorrow  Certification:: I certify this patient will need inpatient services for at least 2 midnights  PT Class (Do Not Modify): Inpatient [101]  PT Acc Code (Do Not Modify): Private [1]       Medical History Past Medical History:  Diagnosis Date  . Gout   . Hyperlipidemia   . Hypertension   . Renal disorder     Allergies Allergies  Allergen Reactions  . Benazepril Hcl Swelling    Swelling of the lips    IV Location/Drains/Wounds Patient Lines/Drains/Airways Status   Active Line/Drains/Airways    Name:   Placement date:   Placement time:   Site:   Days:   Peripheral IV 11/15/18 Right Antecubital   11/15/18    1147    Antecubital   less than 1          Labs/Imaging Results for orders placed or performed during the hospital encounter of 11/15/18 (from the past 48 hour(s))  Comprehensive metabolic panel     Status: Abnormal   Collection Time: 11/15/18 11:34 AM  Result Value Ref Range   Sodium 135 135 - 145 mmol/L   Potassium 3.4 (L) 3.5 - 5.1 mmol/L   Chloride 96 (L) 98 - 111 mmol/L   CO2 25 22 - 32 mmol/L   Glucose, Bld 111 (H) 70 - 99 mg/dL   BUN 30 (H) 6 - 20 mg/dL   Creatinine, Ser 2.13 (H) 0.61 - 1.24 mg/dL   Calcium 9.0 8.9 - 08.6 mg/dL   Total Protein 8.4 (H) 6.5 - 8.1 g/dL   Albumin 4.5 3.5 - 5.0 g/dL   AST 60 (H) 15 - 41 U/L   ALT 62 (H) 0 - 44 U/L   Alkaline Phosphatase 52 38 - 126 U/L   Total Bilirubin 0.8 0.3 - 1.2 mg/dL   GFR calc non Af Amer 39 (L) >60 mL/min   GFR calc Af Amer 45 (L) >60 mL/min   Anion gap 14 5 - 15    Comment: Performed at [facility], [address] [place], [place] [address]  Lipase, blood     Status: None   Collection Time: 11/15/18 11:34 AM  Result Value Ref Range   Lipase 31 11 - 51 U/L    Comment: Performed at [facility], [address] [place], [place] [address]  CBC with Differential     Status: Abnormal   Collection Time: 11/15/18 11:34 AM  Result Value Ref Range   WBC 8.4 4.0 - 10.5 K/uL   RBC 5.21 4.22 - 5.81 MIL/uL   Hemoglobin 13.7 13.0 - 17.0 g/dL   HCT 28.4 13.2 - 44.0 %   MCV 80.0 80.0 - 100.0 fL   MCH 26.3 26.0 - 34.0 pg   MCHC 32.9 30.0 - 36.0 g/dL   RDW 10.2 (H) 72.5 - 36.6 %   Platelets 226 150 - 400 K/uL   nRBC 0.0 0.0 - 0.2 %   Neutrophils Relative %  86 %   Neutro Abs 7.1 1.7 - 7.7 K/uL   Lymphocytes Relative 7 %   Lymphs Abs 0.6 (L) 0.7 - 4.0 K/uL   Monocytes Relative 7 %   Monocytes Absolute 0.6 0.1 - 1.0 K/uL   Eosinophils Relative 0 %   Eosinophils Absolute 0.0 0.0 - 0.5 K/uL   Basophils Relative 0 %   Basophils Absolute 0.0 0.0 - 0.1 K/uL   Immature Granulocytes 0 %   Abs Immature Granulocytes 0.03 0.00 - 0.07 K/uL    Comment: Performed at [facility], [address] [place], [place] [address]  Lactic acid, plasma     Status: None   Collection Time: 11/15/18 11:34 AM  Result Value Ref Range   Lactic Acid, Venous 0.9 0.5 - 1.9 mmol/L    Comment: Performed at [facility], [address] [place], [place] [address]  ABO/Rh     Status: None   Collection Time: 11/15/18  1:15 PM  Result Value Ref Range   ABO/RH(D)      O POS Performed at [facility], [address] [place], [place] [address]   SARS Coronavirus 2 [facility](Hospital order, Performed in [facility] hospital lab)     Status: Abnormal   Collection  Time: 11/15/18  1:16 PM   Specimen: Nasopharyngeal Swab  Result Value Ref Range   SARS Coronavirus 2 POSITIVE (A) NEGATIVE    Comment: RESULT CALLED TO, READ BACK BY AND VERIFIED WITH: Mignon PineANGELA BULLOCK,RN [id number] @ 1749 BY J SCOTTON (NOTE) If result is NEGATIVE SARS-CoV-2 target nucleic acids are NOT DETECTED. The SARS-CoV-2 RNA is generally detectable in upper and lower  respiratory specimens during the acute phase of infection. The lowest  concentration of SARS-CoV-2 viral copies this assay can detect is 250  copies / mL. A negative result does not preclude SARS-CoV-2 infection  and should not be used as the sole basis for treatment or other  patient management decisions.  A negative result may occur with  improper specimen collection / handling, submission of specimen other  than nasopharyngeal swab, presence of viral mutation(s) within the  areas targeted by this assay, and inadequate number of viral copies  (<250 copies / mL). A negative result must be combined with clinical  observations, patient history, and epidemiological information. If result is POSITIVE SARS-CoV-2 target nucleic acids are DET ECTED. The SARS-CoV-2 RNA is generally detectable in upper and lower  respiratory specimens during the acute phase of infection.  Positive  results are indicative of active infection with SARS-CoV-2.  Clinical  correlation with patient history and other diagnostic information is  necessary to determine patient infection status.  Positive results do  not rule out bacterial infection or co-infection with other viruses. If result is PRESUMPTIVE POSTIVE SARS-CoV-2 nucleic acids MAY BE PRESENT.   A presumptive positive result was obtained on the submitted specimen  and confirmed on repeat testing.  While 2019 novel coronavirus  (SARS-CoV-2) nucleic acids may be present in the submitted sample  additional confirmatory testing may be necessary for epidemiological  and / or clinical  management purposes  to differentiate between  SARS-CoV-2 and other Sarbecovirus currently known to infect humans.  If clinically indicated additional testing with an alternate test  methodology (LA [phone]) is advised. The SARS-CoV-2 RNA is generally  detectable in upper and lower respiratory specimens during the acute  phase of infection. The expected result is Negative. Fact Sheet for Patients:  BoilerBrush.com.cyhttps://www.fda.gov/media/136312/download Fact Sheet for  Healthcare Providers: BankingDealers.co.za This test is not yet approved or cleared by the [place] and has been authorized for detection and/or diagnosis of SARS-CoV-2 by FDA under an Emergency Use Authorization (EUA).  This EUA will remain in effect (meaning this test can be used) for the duration of the COVID-19 declaration under Section 564(b)(1) of the Act, 21 U.S.C. section 360bbb-3(b)(1), unless the authorization is terminated or revoked sooner. Performed at [facility], [place] [address] [place], [place] [address]   Lactic acid, plasma     Status: None   Collection Time: 11/15/18  1:34 PM  Result Value Ref Range   Lactic Acid, Venous 0.8 0.5 - 1.9 mmol/L    Comment: Performed at [facility], [place] [address] [place], [place] [address]  Lactate dehydrogenase     Status: Abnormal   Collection Time: 11/15/18  1:35 PM  Result Value Ref Range   LDH 316 (H) 98 - 192 U/L    Comment: Performed at [facility], [place] [address], [place], [place] [address]  Sedimentation rate     Status: Abnormal   Collection Time: 11/15/18  1:35 PM  Result Value Ref Range   Sed Rate 27 (H) 0 - 16 mm/hr    Comment: Performed at [facility], [place] [address] [place], [place] [address]  Procalcitonin     Status: None   Collection Time: 11/15/18  1:35 PM  Result Value Ref Range   Procalcitonin 0.14 ng/mL    Comment:        Interpretation: PCT  (Procalcitonin) <= 0.5 ng/mL: Systemic infection (sepsis) is not likely. Local bacterial infection is possible. (NOTE)       Sepsis PCT Algorithm           Lower Respiratory Tract                                      Infection PCT Algorithm    ----------------------------     ----------------------------         PCT < 0.25 ng/mL                PCT < 0.10 ng/mL         Strongly encourage             Strongly discourage   discontinuation of antibiotics    initiation of antibiotics    ----------------------------     -----------------------------       PCT 0.25 - 0.50 ng/mL            PCT 0.10 - 0.25 ng/mL               OR       >80% decrease in PCT            Discourage initiation of                                            antibiotics      Encourage discontinuation           of antibiotics    ----------------------------     -----------------------------         PCT >= 0.50 ng/mL              PCT 0.26 -  0.50 ng/mL               AND        <80% decrease in PCT             Encourage initiation of                                             antibiotics       Encourage continuation           of antibiotics    ----------------------------     -----------------------------        PCT >= 0.50 ng/mL                  PCT > 0.50 ng/mL               AND         increase in PCT                  Strongly encourage                                      initiation of antibiotics    Strongly encourage escalation           of antibiotics                                     -----------------------------                                           PCT <= 0.25 ng/mL                                                 OR                                        > 80% decrease in PCT                                     Discontinue / Do not initiate                                             antibiotics Performed at [facility], [address] [place], [place] [address]   D-dimer,  quantitative (not at [facility])     Status: None   Collection Time: 11/15/18  1:35 PM  Result Value Ref Range   D-Dimer, Quant 0.50 0.00 - 0.50 ug/mL-FEU    Comment: (NOTE) At the manufacturer cut-off of 0.50 ug/mL FEU, this assay has been documented to exclude PE with a sensitivity and negative predictive value of 97 to 99%.  At this time, this  assay has not been approved by the FDA to exclude DVT/VTE. Results should be correlated with clinical presentation. Performed at [facility], [address] [place], [place] [address]   Fibrinogen     Status: Abnormal   Collection Time: 11/15/18  1:35 PM  Result Value Ref Range   Fibrinogen 665 (H) 210 - 475 mg/dL    Comment: Performed at [facility], [address] [place], [place] [address]  Triglycerides     Status: None   Collection Time: 11/15/18  1:36 PM  Result Value Ref Range   Triglycerides 105 <150 mg/dL    Comment: Performed at [facility], [address] [place], [place] [address]  Ferritin     Status: Abnormal   Collection Time: 11/15/18  1:36 PM  Result Value Ref Range   Ferritin 1,056 (H) 24 - 336 ng/mL    Comment: Performed at [facility], [address] [place], [place] [address]  C-reactive protein     Status: Abnormal   Collection Time: 11/15/18  1:36 PM  Result Value Ref Range   CRP 11.7 (H) <1.0 mg/dL    Comment: Performed at [facility], [address] [place], [place] [address]   Dg Chest Port 1 View  Result Date: 11/15/2018 CLINICAL DATA:  Shortness of breath.  Positive COVID-19 EXAM: PORTABLE CHEST 1 VIEW COMPARISON:  August 12, 2012 FINDINGS: There is ill-defined opacity in the left mid lung. Lungs elsewhere clear. Heart is borderline enlarged with pulmonary vascularity normal. No adenopathy. No bone lesions. IMPRESSION: Ill-defined airspace opacity left mid lung consistent with pneumonia. Lungs elsewhere clear. Heart  prominent period no adenopathy evident. Electronically Signed   By: Bretta BangWilliam  Woodruff III M.D.   On: 11/15/2018 12:15    Pending Labs Unresulted Labs (From admission, onward)    Start     Ordered   11/15/18 1335  HIV antibody (Routine Testing)  Add-on,   AD     11/15/18 1335   11/15/18 1335  Glucose 6 phosphate dehydrogenase  Add-on,   AD     11/15/18 1335   11/15/18 1335  Hepatitis B surface antigen  Add-on,   AD     11/15/18 1335   11/15/18 1335  Interleukin-6, Plasma  Add-on,   AD     11/15/18 1335   Signed and Held  SARS Coronavirus 2 [facility](Hospital order, Performed in [facility] hospital lab)  (Novel Coronavirus, [facility](Hospital Order))  Once,   R     Signed and Held   Signed and Held  ABO/Rh  Once,   R     Signed and Held   Signed and Held  CBC with Differential/Platelet  Daily,   R     Signed and Held   Signed and Held  Comprehensive metabolic panel  Daily,   R     Signed and Held   Signed and Held  C-reactive protein  Daily,   R     Signed and Held   Signed and Held  CK  Daily,   R     Signed and Held   Signed and Held  D-dimer, quantitative (not at [facility])  Daily,   R     Signed and Held   Signed and Held  Ferritin  Daily,   R     Signed and Held   Signed and Held  Interleukin-6, Plasma  Daily,   R     Signed and  Held   Signed and Held  Magnesium  Daily,   R     Signed and Held   Signed and Held  Phosphorus  Daily,   R     Signed and Held   Signed and Held  Triglycerides  Daily,   R     Signed and Held          Vitals/Pain Today's Vitals   11/15/18 1442 11/15/18 1530 11/15/18 1815 11/15/18 1915  BP: (!) 112/91 101/77 127/84 121/77  Pulse: 83 80 75 75  Resp: (!) Temp:   98.7 F (37.1 C)   TempSrc:   Oral   SpO2: 96% 94% 94% 95%  Weight:      Height:      PainSc:   0-No pain     Isolation Precautions Airborne and Contact precautions  Medications Medications  amLODipine (NORVASC) tablet 10 mg (has no administration in time range)  carvedilol  (COREG) tablet 25 mg (has no administration in time range)  rosuvastatin (CRESTOR) tablet 10 mg (has no administration in time range)  cefTRIAXone (ROCEPHIN) 1 g in sodium chloride 0.9 % 100 mL IVPB (0 g Intravenous Stopped 11/15/18 1904)  azithromycin (ZITHROMAX) 500 mg in sodium chloride 0.9 % 250 mL IVPB (500 mg Intravenous New Bag/Given 11/15/18 1904)  methylPREDNISolone sodium succinate (SOLU-MEDROL) 125 mg/2 mL injection 60 mg (has no administration in time range)  sodium chloride 0.9 % bolus 1,000 mL (0 mLs Intravenous Stopped 11/15/18 1543)  acetaminophen (TYLENOL) tablet 1,000 mg (1,000 mg Oral Given 11/15/18 1148)  dexamethasone (DECADRON) injection 10 mg (10 mg Intravenous Given 11/15/18 1309)    Mobility walks

## 2018-11-15 NOTE — ED Triage Notes (Signed)
Patient arrived to the ED from home. Pt c/o SOB, Cough, Fever, and Weakness.   Onset of symptoms began last Tuesday. Patient tested positive for COVID-19 two days ago at [facility].   Pt has hx of HTN. Pt stated he takes his medications at home for HTN.   Pt stated he's been unable to break his fever at home (100-102 F).   Pt appears weak and has non-productive cough.

## 2018-11-16 ENCOUNTER — Encounter (HOSPITAL_COMMUNITY): Payer: Self-pay

## 2018-11-16 LAB — ABO/RH: ABO/RH(D): O POS

## 2018-11-16 LAB — FERRITIN: Ferritin: 1515 ng/mL — ABNORMAL HIGH (ref 24–336)

## 2018-11-16 LAB — COMPREHENSIVE METABOLIC PANEL
ALT: 94 U/L — ABNORMAL HIGH (ref 0–44)
AST: 80 U/L — ABNORMAL HIGH (ref 15–41)
Albumin: 4.1 g/dL (ref 3.5–5.0)
Alkaline Phosphatase: 54 U/L (ref 38–126)
Anion gap: 12 (ref 5–15)
BUN: 33 mg/dL — ABNORMAL HIGH (ref 6–20)
CO2: 25 mmol/L (ref 22–32)
Calcium: 9.1 mg/dL (ref 8.9–10.3)
Chloride: 99 mmol/L (ref 98–111)
Creatinine, Ser: 1.61 mg/dL — ABNORMAL HIGH (ref 0.61–1.24)
GFR calc Af Amer: 59 mL/min — ABNORMAL LOW (ref >60)
GFR calc non Af Amer: 51 mL/min — ABNORMAL LOW (ref >60)
Glucose, Bld: 194 mg/dL — ABNORMAL HIGH (ref 70–99)
Potassium: 3.6 mmol/L (ref 3.5–5.1)
Sodium: 136 mmol/L (ref 135–145)
Total Bilirubin: 0.6 mg/dL (ref 0.3–1.2)
Total Protein: 8.3 g/dL — ABNORMAL HIGH (ref 6.5–8.1)

## 2018-11-16 LAB — LACTATE DEHYDROGENASE: LDH: 357 U/L — ABNORMAL HIGH (ref 98–192)

## 2018-11-16 LAB — CBC
HCT: 42.2 % (ref 39.0–52.0)
Hemoglobin: 13.8 g/dL (ref 13.0–17.0)
MCH: 25.8 pg — ABNORMAL LOW (ref 26.0–34.0)
MCHC: 32.7 g/dL (ref 30.0–36.0)
MCV: 79 fL — ABNORMAL LOW (ref 80.0–100.0)
Platelets: 259 10*3/uL (ref 150–400)
RBC: 5.34 MIL/uL (ref 4.22–5.81)
RDW: 15.8 % — ABNORMAL HIGH (ref 11.5–15.5)
WBC: 10.3 10*3/uL (ref 4.0–10.5)
nRBC: 0 % (ref 0.0–0.2)

## 2018-11-16 LAB — HIV ANTIBODY (ROUTINE TESTING W REFLEX): HIV Screen 4th Generation wRfx: NONREACTIVE

## 2018-11-16 LAB — D-DIMER, QUANTITATIVE: D-Dimer, Quant: 0.54 ug/mL-FEU — ABNORMAL HIGH (ref 0.00–0.50)

## 2018-11-16 LAB — C-REACTIVE PROTEIN: CRP: 11.4 mg/dL — ABNORMAL HIGH (ref <1.0)

## 2018-11-16 MED ORDER — ONDANSETRON HCL 4 MG/2ML IJ SOLN: 4.0000 mg | Freq: Four times a day (QID) | INTRAMUSCULAR | Status: DC | PRN

## 2018-11-16 MED ORDER — HYDROCODONE-ACETAMINOPHEN 5-325 MG PO TABS: 1.0000 | ORAL_TABLET | ORAL | Status: DC | PRN

## 2018-11-16 MED ORDER — LIP MEDEX EX OINT
TOPICAL_OINTMENT | CUTANEOUS | Status: DC | PRN
  Filled 2018-11-16: qty 7

## 2018-11-16 MED ORDER — ONDANSETRON HCL 4 MG PO TABS: 4.0000 mg | ORAL_TABLET | Freq: Four times a day (QID) | ORAL | Status: DC | PRN

## 2018-11-16 MED ORDER — POLYETHYLENE GLYCOL 3350 17 G PO PACK: 17.0000 g | PACK | Freq: Every day | ORAL | Status: DC | PRN

## 2018-11-16 MED ORDER — ACETAMINOPHEN 325 MG PO TABS: 650.0000 mg | ORAL_TABLET | Freq: Four times a day (QID) | ORAL | Status: DC | PRN

## 2018-11-16 MED ORDER — ENOXAPARIN SODIUM 80 MG/0.8ML ~~LOC~~ SOLN
80.0000 mg | SUBCUTANEOUS | Status: DC
  Administered 2018-11-16 – 2018-11-18 (×3): 80 mg via SUBCUTANEOUS
  Filled 2018-11-16 (×3): qty 0.8

## 2018-11-16 MED ORDER — HEPARIN SODIUM (PORCINE) 5000 UNIT/ML IJ SOLN: 5000.0000 [IU] | Freq: Three times a day (TID) | INTRAMUSCULAR | Status: DC

## 2018-11-16 NOTE — Progress Notes (Signed)
Pt has gone into the bathroom to give himself a "washup" at the sink. Demonstrates good use of the IS, VSs as noted, he has updated family on his status.

## 2018-11-16 NOTE — Progress Notes (Signed)
PROGRESS NOTE  Martin [name] ZOX:[id number] DOB: 05/24/1970 DOA: 11/15/2018 PCP: Mirna MiresHill, Gerald, MD   LOS: 1 day   Brief Narrative / Interim history: 48 year old male with history of hypertension, hyperlipidemia, gout, obesity, presents to the ED and is admitted to the hospital on 11/15/2018 with fever, productive cough with greenish/brown sputum, malaise and suggested shortness of breath which has been going around for the past 11 days.  He tested positive at urgent care about couple of days ago.  On admission he was febrile, not hypoxic and satting well on room air, and chest x-ray showed lobar pneumonia in the left midlung.  He was placed on IV antibiotics and was admitted to the hospital.  Subjective: Feels well this morning, denies any shortness of breath.  Assessment & Plan: Principal Problem:   COVID-19 virus infection Active Problems:   Essential hypertension   Principal Problem Covid-19 Viral Illness /lobar pneumonia -Patient has been having viral symptoms for the past 12 days now.  He does have some evidence of increased inflammatory markers with elevated CRP, LDH and ferritin however his chest x-ray is minimally abnormal and he is not hypoxic, satting 99% on room air on my evaluation -It is possible he may have developed bacterial pneumonia as a secondary infection given prolonged viral illness at home, and appearance suggests lobar pneumonia rather than diffuse multifocal viral involvement -Patient was started on ceftriaxone and azithromycin which I would favor to continue for now -Continue steroids to help with the inflammatory aspect of COVID-19   COVID-19 Labs  Recent Labs    11/15/18 1335 11/15/18 1336  DDIMER 0.50  --   FERRITIN  --  1,056*  LDH 316*  --   CRP  --  11.7*    Lab Results  Component Value Date   SARSCOV2NAA POSITIVE (A) 11/15/2018   Active Problems Acute kidney injury -Unclear baseline, patient's most recent creatinine was 1.3 in 2017,  currently at 1.99 -This may be multifactorial in the setting of home diuretics and ARB use -Hold spironolactone, chlorthalidone, along with losartan, repeat renal function is stable at this morning  Hypertension -Blood pressure is normal this morning.  He is on amlodipine which will be continued.  Hold chlorthalidone, losartan, spironolactone as well as hydralazine.  Reintroduce as blood pressure allows.  Morbid obesity -Patient will significantly benefit from weight loss  Hyperlipidemia -Continue home medications   Scheduled Meds: . amLODipine  10 mg Oral Daily  . enoxaparin (LOVENOX) injection  80 mg Subcutaneous Q24H  . methylPREDNISolone (SOLU-MEDROL) injection  60 mg Intravenous Q8H  . rosuvastatin  10 mg Oral Daily   Continuous Infusions: . azithromycin 500 mg (11/15/18 1904)  . cefTRIAXone (ROCEPHIN)  IV Stopped (11/15/18 1904)   PRN Meds:.acetaminophen, HYDROcodone-acetaminophen, ondansetron **OR** ondansetron (ZOFRAN) IV, polyethylene glycol  DVT prophylaxis: Lovenox Code Status: Full code Family Communication: d/w patient  Disposition Plan: home when ready   Consultants:   None   Procedures:   None   Antimicrobials:  Ceftriaxone / Azithro 7/11 >>   Objective: Vitals:   11/15/18 2300 11/16/18 0000 11/16/18 0401 11/16/18 0718  BP: 134/79 133/69 130/69 117/62  Pulse: 82 78 72 68  Resp: 18 (!) 22 20   Temp:  99.7 F (37.6 C) 98.8 F (37.1 C) 98.6 F (37 C)  TempSrc:  Oral Oral   SpO2: 96% 95% 92% 99%  Weight:  (!) 157 kg    Height:  6\' 4"  (1.93 m)      Intake/Output Summary (Last  24 hours) at 11/16/2018 1149 Last data filed at 11/16/2018 0630 Gross per 24 hour  Intake 2269.91 ml  Output -  Net 2269.91 ml   Filed Weights   11/15/18 1130 11/16/18 0000  Weight: (!) 161 kg (!) 157 kg    Examination:  Constitutional: NAD Eyes: PERRL, lids and conjunctivae normal ENMT: Mucous membranes are moist. No oropharyngeal exudates Respiratory: clear to  auscultation bilaterally, no wheezing, no crackles. Normal respiratory effort. No accessory muscle use.  Cardiovascular: Regular rate and rhythm, no murmurs / rubs / gallops. No LE edema.  Abdomen: no tenderness. Bowel sounds positive.  Musculoskeletal: no clubbing / cyanosis.  Skin: no rashes Neurologic: CN 2-12 grossly intact. Strength 5/5 in all 4.  Psychiatric: Normal judgment and insight. Alert and oriented x 3. Normal mood.    Data Reviewed: I have independently reviewed following labs and imaging studies   CBC: Recent Labs  Lab 11/15/18 1134 11/16/18 1030  WBC 8.4 10.3  NEUTROABS 7.1  --   HGB 13.7 13.8  HCT 41.7 42.2  MCV 80.0 79.0*  PLT 226 259   Basic Metabolic Panel: Recent Labs  Lab 11/15/18 1134  NA 135  K 3.4*  CL 96*  CO2 25  GLUCOSE 111*  BUN 30*  CREATININE 1.99*  CALCIUM 9.0   GFR: Estimated Creatinine Clearance: 75.4 mL/min (A) (by C-G formula based on SCr of 1.99 mg/dL (H)). Liver Function Tests: Recent Labs  Lab 11/15/18 1134  AST 60*  ALT 62*  ALKPHOS 52  BILITOT 0.8  PROT 8.4*  ALBUMIN 4.5   Recent Labs  Lab 11/15/18 1134  LIPASE 31   No results for input(s): AMMONIA in the last 168 hours. Coagulation Profile: No results for input(s): INR, PROTIME in the last 168 hours. Cardiac Enzymes: No results for input(s): CKTOTAL, CKMB, CKMBINDEX, TROPONINI in the last 168 hours. BNP (last 3 results) No results for input(s): PROBNP in the last 8760 hours. HbA1C: No results for input(s): HGBA1C in the last 72 hours. CBG: No results for input(s): GLUCAP in the last 168 hours. Lipid Profile: Recent Labs    11/15/18 1336  TRIG 105   Thyroid Function Tests: No results for input(s): TSH, T4TOTAL, FREET4, T3FREE, THYROIDAB in the last 72 hours. Anemia Panel: Recent Labs    11/15/18 1336  FERRITIN 1,056*   Urine analysis:    Component Value Date/Time   COLORURINE YELLOW 11/10/2015 2041   APPEARANCEUR CLEAR 11/10/2015 2041    LABSPEC 1.029 11/10/2015 2041   PHURINE 5.5 11/10/2015 2041   GLUCOSEU NEGATIVE 11/10/2015 2041   HGBUR NEGATIVE 11/10/2015 2041   BILIRUBINUR SMALL (A) 11/10/2015 2041   KETONESUR NEGATIVE 11/10/2015 2041   PROTEINUR 100 (A) 11/10/2015 2041   NITRITE NEGATIVE 11/10/2015 2041   LEUKOCYTESUR NEGATIVE 11/10/2015 2041   Sepsis Labs: Invalid input(s): PROCALCITONIN, LACTICIDVEN  Recent Results (from the past 240 hour(s))  SARS Coronavirus 2 [facility](Hospital order, Performed [facility])     Status: Abnormal   Collection Time: 11/15/18  1:16 PM   Specimen: Nasopharyngeal Swab  Result Value Ref Range Status   SARS Coronavirus 2 POSITIVE (A) NEGATIVE Final    Comment: RESULT CALLED TO, READ BACK BY AND VERIFIED WITH: Mignon PineANGELA BULLOCK,RN [id number] @ 1749 BY J SCOTTON (NOTE) If result is NEGATIVE SARS-CoV-2 target nucleic acids are NOT DETECTED. The SARS-CoV-2 RNA is generally detectable in upper and lower  respiratory specimens during the acute phase of infection. The lowest  concentration of SARS-CoV-2 viral copies  this assay can detect is 250  copies / mL. A negative result does not preclude SARS-CoV-2 infection  and should not be used as the sole basis for treatment or other  patient management decisions.  A negative result may occur with  improper specimen collection / handling, submission of specimen other  than nasopharyngeal swab, presence of viral mutation(s) within the  areas targeted by this assay, and inadequate number of viral copies  (<250 copies / mL). A negative result must be combined with clinical  observations, patient history, and epidemiological information. If result is POSITIVE SARS-CoV-2 target nucleic acids are DET ECTED. The SARS-CoV-2 RNA is generally detectable in upper and lower  respiratory specimens during the acute phase of infection.  Positive  results are indicative of active infection with SARS-CoV-2.  Clinical  correlation with patient history  and other diagnostic information is  necessary to determine patient infection status.  Positive results do  not rule out bacterial infection or co-infection with other viruses. If result is PRESUMPTIVE POSTIVE SARS-CoV-2 nucleic acids MAY BE PRESENT.   A presumptive positive result was obtained on the submitted specimen  and confirmed on repeat testing.  While 2019 novel coronavirus  (SARS-CoV-2) nucleic acids may be present in the submitted sample  additional confirmatory testing may be necessary for epidemiological  and / or clinical management purposes  to differentiate between  SARS-CoV-2 and other Sarbecovirus currently known to infect humans.  If clinically indicated additional testing with an alternate test  methodology ([place]) is advised. The SARS-CoV-2 RNA is generally  detectable in upper and lower respiratory specimens during the acute  phase of infection. The expected result is Negative. Fact Sheet for Patients:  StrictlyIdeas.no Fact Sheet for Healthcare Providers: BankingDealers.co.za This test is not yet approved or cleared by the [place] FDA and has been authorized for detection and/or diagnosis of SARS-CoV-2 by FDA under an Emergency Use Authorization (EUA).  This EUA will remain in effect (meaning this test can be used) for the duration of the COVID-19 declaration under Section 564(b)(1) of the Act, 21 U.S.C. section 360bbb-3(b)(1), unless the authorization is terminated or revoked sooner. Performed at [facility], [place] [address] [place], [place] [address]       Radiology Studies: Dg Chest Port 1 View  Result Date: 11/15/2018 CLINICAL DATA:  Shortness of breath.  Positive COVID-19 EXAM: PORTABLE CHEST 1 VIEW COMPARISON:  August 12, 2012 FINDINGS: There is ill-defined opacity in the left mid lung. Lungs elsewhere clear. Heart is borderline enlarged with pulmonary vascularity normal. No  adenopathy. No bone lesions. IMPRESSION: Ill-defined airspace opacity left mid lung consistent with pneumonia. Lungs elsewhere clear. Heart prominent period no adenopathy evident. Electronically Signed   By: Lowella Grip III M.D.   On: 11/15/2018 12:15    Marzetta Board, MD, PhD [facility]  Contact via  [email or web address]  [place] P: [phone] F: [phone]

## 2018-11-16 NOTE — Progress Notes (Signed)
Updated pt's spouse on plan of care. Questions asked and answered.

## 2018-11-16 NOTE — Progress Notes (Signed)
Patient received in transfer from [facility].  48 yo M with COVID PNA, ? AKI with creat 1.99.  Patient seen and evaluated at bedside.  Currently not requiring any oxygen, no respiratory distress.  No complaints at this time.

## 2018-11-17 ENCOUNTER — Inpatient Hospital Stay (HOSPITAL_COMMUNITY): Payer: Managed Care, Other (non HMO)

## 2018-11-17 LAB — CBC
HCT: 44 % (ref 39.0–52.0)
Hemoglobin: 13.9 g/dL (ref 13.0–17.0)
MCH: 25.5 pg — ABNORMAL LOW (ref 26.0–34.0)
MCHC: 31.6 g/dL (ref 30.0–36.0)
MCV: 80.6 fL (ref 80.0–100.0)
Platelets: 324 10*3/uL (ref 150–400)
RBC: 5.46 MIL/uL (ref 4.22–5.81)
RDW: 16.2 % — ABNORMAL HIGH (ref 11.5–15.5)
WBC: 20.8 10*3/uL — ABNORMAL HIGH (ref 4.0–10.5)
nRBC: 0 % (ref 0.0–0.2)

## 2018-11-17 LAB — COMPREHENSIVE METABOLIC PANEL
ALT: 112 U/L — ABNORMAL HIGH (ref 0–44)
AST: 85 U/L — ABNORMAL HIGH (ref 15–41)
Albumin: 4.5 g/dL (ref 3.5–5.0)
Alkaline Phosphatase: 57 U/L (ref 38–126)
Anion gap: 16 — ABNORMAL HIGH (ref 5–15)
BUN: 40 mg/dL — ABNORMAL HIGH (ref 6–20)
CO2: 23 mmol/L (ref 22–32)
Calcium: 9.5 mg/dL (ref 8.9–10.3)
Chloride: 100 mmol/L (ref 98–111)
Creatinine, Ser: 1.71 mg/dL — ABNORMAL HIGH (ref 0.61–1.24)
GFR calc Af Amer: 54 mL/min — ABNORMAL LOW (ref >60)
GFR calc non Af Amer: 47 mL/min — ABNORMAL LOW (ref >60)
Glucose, Bld: 203 mg/dL — ABNORMAL HIGH (ref 70–99)
Potassium: 4 mmol/L (ref 3.5–5.1)
Sodium: 139 mmol/L (ref 135–145)
Total Bilirubin: 0.3 mg/dL (ref 0.3–1.2)
Total Protein: 8.7 g/dL — ABNORMAL HIGH (ref 6.5–8.1)

## 2018-11-17 LAB — GLUCOSE 6 PHOSPHATE DEHYDROGENASE
G6PDH: 8.8 U/g{Hb} (ref 4.6–13.5)
Hemoglobin: 12.7 g/dL — ABNORMAL LOW (ref 13.0–17.7)

## 2018-11-17 LAB — INTERLEUKIN-6, PLASMA: Interleukin-6, Plasma: 18.1 pg/mL — ABNORMAL HIGH (ref 0.0–12.2)

## 2018-11-17 LAB — LACTATE DEHYDROGENASE: LDH: 396 U/L — ABNORMAL HIGH (ref 98–192)

## 2018-11-17 LAB — FERRITIN: Ferritin: 1421 ng/mL — ABNORMAL HIGH (ref 24–336)

## 2018-11-17 LAB — C-REACTIVE PROTEIN: CRP: 7.8 mg/dL — ABNORMAL HIGH (ref <1.0)

## 2018-11-17 LAB — HEPATITIS B SURFACE ANTIGEN: Hepatitis B Surface Ag: NEGATIVE

## 2018-11-17 LAB — D-DIMER, QUANTITATIVE: D-Dimer, Quant: 0.43 ug/mL-FEU (ref 0.00–0.50)

## 2018-11-17 NOTE — Progress Notes (Signed)
PROGRESS NOTE  Martin [name] ZOX:[id number] DOB: 10/08/1970 DOA: 11/15/2018 PCP: Mirna MiresHill, Gerald, MD   LOS: 2 days   Brief Narrative / Interim history: 48 year old male with history of hypertension, hyperlipidemia, gout, obesity, presents to the ED and is admitted to the hospital on 11/15/2018 with fever, productive cough with greenish/brown sputum, malaise and suggested shortness of breath which has been going around for the past 11 days.  He tested positive at urgent care about couple of days ago.  On admission he was febrile, not hypoxic and satting well on room air, and chest x-ray showed lobar pneumonia in the left midlung.  He was placed on IV antibiotics and was admitted to the hospital.  Subjective: Feels slightly weak this morning, denies shortness of breath, no chest pain, no nausea/vomiting  Assessment & Plan: Principal Problem:   COVID-19 virus infection Active Problems:   Essential hypertension   Principal Problem Covid-19 Viral Illness /lobar pneumonia -Patient has been having viral symptoms for the past 13 days now.   -He is overall stable but inflammatory markers are still elevated however improving -It is possible he may have developed bacterial pneumonia as a secondary infection given prolonged viral illness at home, and appearance suggests lobar pneumonia rather than diffuse multifocal viral involvement.  In addition he has been having a productive cough with brown sputum -Patient was started on ceftriaxone and azithromycin which I would favor to continue for now for a total of 5 days -Continue steroids to help with the inflammatory aspect of COVID-19 -Has new leukocytosis today of 20,000, likely due to steroids but will monitor for fever/other symptoms   COVID-19 Labs  Recent Labs    11/15/18 1335 11/15/18 1336 11/16/18 1030 11/17/18 0215  DDIMER 0.50  --  0.54* 0.43  FERRITIN  --  1,056* 1,515* 1,421*  LDH 316*  --  357* 396*  CRP  --  11.7* 11.4* 7.8*     Lab Results  Component Value Date   SARSCOV2NAA POSITIVE (A) 11/15/2018   Active Problems Acute kidney injury on probable chronic kidney disease, unknown stage -Unclear baseline, patient's most recent creatinine was 1.3 in 2017, 1.99 on admission 1.7 this morning -This may be multifactorial in the setting of home diuretics and ARB use -Hold spironolactone, chlorthalidone, along with losartan -Patient tells me he does have mild kidney impairment but does not know his normal creatinine  Hypertension -Slightly hypotensive this morning, in the 1 teens on repeat.  He is on amlodipine which will be continued.  Hold chlorthalidone, losartan, spironolactone as well as hydralazine.  Reintroduce as blood pressure allows.  Morbid obesity -Patient will significantly benefit from weight loss  Hyperlipidemia -Continue home medications   Scheduled Meds: . amLODipine  10 mg Oral Daily  . enoxaparin (LOVENOX) injection  80 mg Subcutaneous Q24H  . methylPREDNISolone (SOLU-MEDROL) injection  60 mg Intravenous Q8H  . rosuvastatin  10 mg Oral Daily   Continuous Infusions: . azithromycin 500 mg (11/16/18 1634)  . cefTRIAXone (ROCEPHIN)  IV 1 g (11/16/18 1542)   PRN Meds:.acetaminophen, HYDROcodone-acetaminophen, lip balm, ondansetron **OR** ondansetron (ZOFRAN) IV, polyethylene glycol  DVT prophylaxis: Lovenox Code Status: Full code Family Communication: d/w patient  Disposition Plan: home when ready   Consultants:   None   Procedures:   None   Antimicrobials:  Ceftriaxone / Azithro 7/11 >>   Objective: Vitals:   11/16/18 1830 11/16/18 2000 11/17/18 0505 11/17/18 0825  BP: (!) 126/59 122/65 (!) 97/48 116/71  Pulse: 76 74 (!) 59  Resp:  (!) 22 18   Temp: 98.6 F (37 C) 97.9 F (36.6 C) 98.1 F (36.7 C)   TempSrc: Oral Oral Oral   SpO2: 96% 96% 91%   Weight:      Height:        Intake/Output Summary (Last 24 hours) at 11/17/2018 1007 Last data filed at 11/16/2018 2000  Gross per 24 hour  Intake -  Output 600 ml  Net -600 ml   Filed Weights   11/15/18 1130 11/16/18 0000  Weight: (!) 161 kg (!) 157 kg    Examination:  Constitutional: No distress, sitting in bedside chair Eyes: No scleral icterus ENMT: Moist mixed membranes Respiratory: Diminished at the bases but overall clear without wheezing or crackles, normal respiratory effort Cardiovascular: Regular rate and rhythm, no murmurs.  No peripheral edema Abdomen: Soft, NT, ND, bowel sounds positive Musculoskeletal: no clubbing / cyanosis.  Skin: no rashes Neurologic: No focal deficits, equal strength Psychiatric: Normal judgment and insight. Alert and oriented x 3. Normal mood.    Data Reviewed: I have independently reviewed following labs and imaging studies   CBC: Recent Labs  Lab 11/15/18 1134 11/16/18 1030 11/17/18 0215  WBC 8.4 10.3 20.8*  NEUTROABS 7.1  --   --   HGB 13.7 13.8 13.9  HCT 41.7 42.2 44.0  MCV 80.0 79.0* 80.6  PLT 226 259 324   Basic Metabolic Panel: Recent Labs  Lab 11/15/18 1134 11/16/18 1030 11/17/18 0215  NA 135 136 139  K 3.4* 3.6 4.0  CL 96* 99 100  CO2 25 25 23   GLUCOSE 111* 194* 203*  BUN 30* 33* 40*  CREATININE 1.99* 1.61* 1.71*  CALCIUM 9.0 9.1 9.5   GFR: Estimated Creatinine Clearance: 87.7 mL/min (A) (by C-G formula based on SCr of 1.71 mg/dL (H)). Liver Function Tests: Recent Labs  Lab 11/15/18 1134 11/16/18 1030 11/17/18 0215  AST 60* 80* 85*  ALT 62* 94* 112*  ALKPHOS 52 54 57  BILITOT 0.8 0.6 0.3  PROT 8.4* 8.3* 8.7*  ALBUMIN 4.5 4.1 4.5   Recent Labs  Lab 11/15/18 1134  LIPASE 31   No results for input(s): AMMONIA in the last 168 hours. Coagulation Profile: No results for input(s): INR, PROTIME in the last 168 hours. Cardiac Enzymes: No results for input(s): CKTOTAL, CKMB, CKMBINDEX, TROPONINI in the last 168 hours. BNP (last 3 results) No results for input(s): PROBNP in the last 8760 hours. HbA1C: No results for  input(s): HGBA1C in the last 72 hours. CBG: No results for input(s): GLUCAP in the last 168 hours. Lipid Profile: Recent Labs    11/15/18 1336  TRIG 105   Thyroid Function Tests: No results for input(s): TSH, T4TOTAL, FREET4, T3FREE, THYROIDAB in the last 72 hours. Anemia Panel: Recent Labs    11/16/18 1030 11/17/18 0215  FERRITIN 1,515* 1,421*   Urine analysis:    Component Value Date/Time   COLORURINE YELLOW 11/10/2015 2041   APPEARANCEUR CLEAR 11/10/2015 2041   LABSPEC 1.029 11/10/2015 2041   PHURINE 5.5 11/10/2015 2041   GLUCOSEU NEGATIVE 11/10/2015 2041   HGBUR NEGATIVE 11/10/2015 2041   BILIRUBINUR SMALL (A) 11/10/2015 2041   KETONESUR NEGATIVE 11/10/2015 2041   PROTEINUR 100 (A) 11/10/2015 2041   NITRITE NEGATIVE 11/10/2015 2041   LEUKOCYTESUR NEGATIVE 11/10/2015 2041   Sepsis Labs: Invalid input(s): PROCALCITONIN, LACTICIDVEN  Recent Results (from the past 240 hour(s))  SARS Coronavirus 2 [facility](Hospital order, Performed in [facility] lab)     Status: Abnormal  Collection Time: 11/15/18  1:16 PM   Specimen: Nasopharyngeal Swab  Result Value Ref Range Status   SARS Coronavirus 2 POSITIVE (A) NEGATIVE Final    Comment: RESULT CALLED TO, READ BACK BY AND VERIFIED WITH: Natale Lay [id number] @ 9449 BY J SCOTTON (NOTE) If result is NEGATIVE SARS-CoV-2 target nucleic acids are NOT DETECTED. The SARS-CoV-2 RNA is generally detectable in upper and lower  respiratory specimens during the acute phase of infection. The lowest  concentration of SARS-CoV-2 viral copies this assay can detect is 250  copies / mL. A negative result does not preclude SARS-CoV-2 infection  and should not be used as the sole basis for treatment or other  patient management decisions.  A negative result may occur with  improper specimen collection / handling, submission of specimen other  than nasopharyngeal swab, presence of viral mutation(s) within the  areas targeted by this  assay, and inadequate number of viral copies  (<250 copies / mL). A negative result must be combined with clinical  observations, patient history, and epidemiological information. If result is POSITIVE SARS-CoV-2 target nucleic acids are DET ECTED. The SARS-CoV-2 RNA is generally detectable in upper and lower  respiratory specimens during the acute phase of infection.  Positive  results are indicative of active infection with SARS-CoV-2.  Clinical  correlation with patient history and other diagnostic information is  necessary to determine patient infection status.  Positive results do  not rule out bacterial infection or co-infection with other viruses. If result is PRESUMPTIVE POSTIVE SARS-CoV-2 nucleic acids MAY BE PRESENT.   A presumptive positive result was obtained on the submitted specimen  and confirmed on repeat testing.  While 2019 novel coronavirus  (SARS-CoV-2) nucleic acids may be present in the submitted sample  additional confirmatory testing may be necessary for epidemiological  and / or clinical management purposes  to differentiate between  SARS-CoV-2 and other Sarbecovirus currently known to infect humans.  If clinically indicated additional testing with an alternate test  methodology ([place]) is advised. The SARS-CoV-2 RNA is generally  detectable in upper and lower respiratory specimens during the acute  phase of infection. The expected result is Negative. Fact Sheet for Patients:  StrictlyIdeas.no Fact Sheet for Healthcare Providers: BankingDealers.co.za This test is not yet approved or cleared by the [place] FDA and has been authorized for detection and/or diagnosis of SARS-CoV-2 by FDA under an Emergency Use Authorization (EUA).  This EUA will remain in effect (meaning this test can be used) for the duration of the COVID-19 declaration under Section 564(b)(1) of the Act, 21 U.S.C. section 360bbb-3(b)(1),  unless the authorization is terminated or revoked sooner. Performed at [facility], [place] [address], [place], [place] [address]       Radiology Studies: Dg Chest Port 1 View  Result Date: 11/15/2018 CLINICAL DATA:  Shortness of breath.  Positive COVID-19 EXAM: PORTABLE CHEST 1 VIEW COMPARISON:  August 12, 2012 FINDINGS: There is ill-defined opacity in the left mid lung. Lungs elsewhere clear. Heart is borderline enlarged with pulmonary vascularity normal. No adenopathy. No bone lesions. IMPRESSION: Ill-defined airspace opacity left mid lung consistent with pneumonia. Lungs elsewhere clear. Heart prominent period no adenopathy evident. Electronically Signed   By: Lowella Grip III M.D.   On: 11/15/2018 12:15    Marzetta Board, MD, PhD [facility]  Contact via  [email or web address]  [place] P: [phone] F: [phone]

## 2018-11-17 NOTE — Progress Notes (Signed)
Called to speak with pt's wife. Gave update on pts condition. Questions asked and answered.

## 2018-11-17 NOTE — Progress Notes (Signed)
Pt appears very sleepy. States he just woke up. Pt's wife called at 89 asking for an update and wanted to know why her husband was not answering his phone. Explained to pt's wife that he was asleep and would be asked to call her.  Pt asked to call his wife as she is worried. Pt states"i will call her later".

## 2018-11-18 DIAGNOSIS — D72829 Elevated white blood cell count, unspecified: Secondary | ICD-10-CM

## 2018-11-18 DIAGNOSIS — J1289 Other viral pneumonia: Secondary | ICD-10-CM

## 2018-11-18 LAB — CBC
HCT: 40.3 % (ref 39.0–52.0)
Hemoglobin: 12.6 g/dL — ABNORMAL LOW (ref 13.0–17.0)
MCH: 25.3 pg — ABNORMAL LOW (ref 26.0–34.0)
MCHC: 31.3 g/dL (ref 30.0–36.0)
MCV: 80.8 fL (ref 80.0–100.0)
Platelets: 313 10*3/uL (ref 150–400)
RBC: 4.99 MIL/uL (ref 4.22–5.81)
RDW: 16.3 % — ABNORMAL HIGH (ref 11.5–15.5)
WBC: 21 10*3/uL — ABNORMAL HIGH (ref 4.0–10.5)
nRBC: 0 % (ref 0.0–0.2)

## 2018-11-18 LAB — BASIC METABOLIC PANEL
Anion gap: 14 (ref 5–15)
BUN: 42 mg/dL — ABNORMAL HIGH (ref 6–20)
CO2: 24 mmol/L (ref 22–32)
Calcium: 8.6 mg/dL — ABNORMAL LOW (ref 8.9–10.3)
Chloride: 100 mmol/L (ref 98–111)
Creatinine, Ser: 1.38 mg/dL — ABNORMAL HIGH (ref 0.61–1.24)
GFR calc Af Amer: >60 mL/min (ref >60)
GFR calc non Af Amer: >60 mL/min (ref >60)
Glucose, Bld: 158 mg/dL — ABNORMAL HIGH (ref 70–99)
Potassium: 3.9 mmol/L (ref 3.5–5.1)
Sodium: 138 mmol/L (ref 135–145)

## 2018-11-18 LAB — C-REACTIVE PROTEIN: CRP: 3.5 mg/dL — ABNORMAL HIGH (ref <1.0)

## 2018-11-18 LAB — D-DIMER, QUANTITATIVE: D-Dimer, Quant: 0.28 ug/mL-FEU (ref 0.00–0.50)

## 2018-11-18 LAB — FERRITIN: Ferritin: 877 ng/mL — ABNORMAL HIGH (ref 24–336)

## 2018-11-18 LAB — LACTATE DEHYDROGENASE: LDH: 347 U/L — ABNORMAL HIGH (ref 98–192)

## 2018-11-18 MED ORDER — CEFDINIR 300 MG PO CAPS: 300.0000 mg | ORAL_CAPSULE | Freq: Two times a day (BID) | ORAL | 0 refills | Status: DC

## 2018-11-18 MED ORDER — DEXAMETHASONE 6 MG PO TABS
6.0000 mg | ORAL_TABLET | Freq: Every day | ORAL | Status: DC
  Administered 2018-11-18: 6 mg via ORAL
  Filled 2018-11-18: qty 1

## 2018-11-18 MED ORDER — DEXAMETHASONE 6 MG PO TABS: 6.0000 mg | ORAL_TABLET | Freq: Every day | ORAL | 0 refills | Status: DC

## 2018-11-18 NOTE — Discharge Instructions (Signed)
Follow with Iona Beard, MD in 1-2 weeks  For your blood pressure continue amlodipine alone.  On this medication your blood pressure has been stable in the hospital.  Please continue to monitor blood pressure at home using your cuff, monitor your blood pressure twice daily and if it starts to increase above 397 systolic please resume taking your hydralazine.  If after hydralazine continues to be elevated please call your PCP  Please get a complete blood count and chemistry panel checked by your Primary MD at your next visit, and again as instructed by your Primary MD. Please get your medications reviewed and adjusted by your Primary MD.  Please request your Primary MD to go over all Hospital Tests and Procedure/Radiological results at the follow up, please get all Hospital records sent to your Prim MD by signing hospital release before you go home.  In some cases, there will be blood work, cultures and biopsy results pending at the time of your discharge. Please request that your primary care M.D. goes through all the records of your hospital data and follows up on these results.  If you had Pneumonia of Lung problems at the Hospital: Please get a 2 view Chest X ray done in 6-8 weeks after hospital discharge or sooner if instructed by your Primary MD.  If you have Congestive Heart Failure: Please call your Cardiologist or Primary MD anytime you have any of the following symptoms:  1) 3 pound weight gain in 24 hours or 5 pounds in 1 week  2) shortness of breath, with or without a dry hacking cough  3) swelling in the hands, feet or stomach  4) if you have to sleep on extra pillows at night in order to breathe  Follow cardiac low salt diet and 1.5 lit/day fluid restriction.  If you have diabetes Accuchecks 4 times/day, Once in AM empty stomach and then before each meal. Log in all results and show them to your primary doctor at your next visit. If any glucose reading is under 80 or above 300  call your primary MD immediately.  If you have Seizure/Convulsions/Epilepsy: Please do not drive, operate heavy machinery, participate in activities at heights or participate in high speed sports until you have seen by Primary MD or a Neurologist and advised to do so again. Per [facility] statutes, patients with seizures are not allowed to drive until they have been seizure-free for six months.  Use caution when using heavy equipment or power tools. Avoid working on ladders or at heights. Take showers instead of baths. Ensure the water temperature is not too high on the home water heater. Do not go swimming alone. Do not lock yourself in a room alone (i.e. bathroom). When caring for infants or small children, sit down when holding, feeding, or changing them to minimize risk of injury to the child in the event you have a seizure. Maintain good sleep hygiene. Avoid alcohol.   If you had Gastrointestinal Bleeding: Please ask your Primary MD to check a complete blood count within one week of discharge or at your next visit. Your endoscopic/colonoscopic biopsies that are pending at the time of discharge, will also need to followed by your Primary MD.  Get Medicines reviewed and adjusted. Please take all your medications with you for your next visit with your Primary MD  Please request your Primary MD to go over all hospital tests and procedure/radiological results at the follow up, please ask your Primary MD to get all  Hospital records sent to his/her office.  If you experience worsening of your admission symptoms, develop shortness of breath, life threatening emergency, suicidal or homicidal thoughts you must seek medical attention immediately by calling 911 or calling your MD immediately  if symptoms less severe.  You must read complete instructions/literature along with all the possible adverse reactions/side effects for all the Medicines you take and that have been prescribed to you. Take any  new Medicines after you have completely understood and accpet all the possible adverse reactions/side effects.   Do not drive or operate heavy machinery when taking Pain medications.   Do not take more than prescribed Pain, Sleep and Anxiety Medications  Special Instructions: If you have smoked or chewed Tobacco  in the last 2 yrs please stop smoking, stop any regular Alcohol  and or any Recreational drug use.  Wear Seat belts while driving.  Please note You were cared for by a hospitalist during your hospital stay. If you have any questions about your discharge medications or the care you received while you were in the hospital after you are discharged, you can call the unit and asked to speak with the hospitalist on call if the hospitalist that took care of you is not available. Once you are discharged, your primary care physician will handle any further medical issues. Please note that NO REFILLS for any discharge medications will be authorized once you are discharged, as it is imperative that you return to your primary care physician (or establish a relationship with a primary care physician if you do not have one) for your aftercare needs so that they can reassess your need for medications and monitor your lab values.  You can reach the hospitalist office at phone [phone] or fax [phone]   If you do not have a primary care physician, you can call [phone] for a physician referral.  Activity: As tolerated with Full fall precautions use walker/cane & assistance as needed    Diet: low sodium, low calorie for weight loss  Disposition Home    COVID-19: How to Protect Yourself and Others Know how it spreads  There is currently no vaccine to prevent coronavirus disease 2019 (COVID-19).  The best way to prevent illness is to avoid being exposed to this virus.  The virus is thought to spread mainly from person-to-person. ? Between people who are in close contact with one another  (within about 6 feet). ? Through respiratory droplets produced when an infected person coughs, sneezes or talks. ? These droplets can land in the mouths or noses of people who are nearby or possibly be inhaled into the lungs. ? Some recent studies have suggested that COVID-19 may be spread by people who are not showing symptoms. Everyone should Clean your hands often  Wash your hands often with soap and water for at least 20 seconds especially after you have been in a public place, or after blowing your nose, coughing, or sneezing.  If soap and water are not readily available, use a hand sanitizer that contains at least 60% alcohol. Cover all surfaces of your hands and rub them together until they feel dry.  Avoid touching your eyes, nose, and mouth with unwashed hands. Avoid close contact  Stay home if you are sick.  Avoid close contact with people who are sick.  Put distance between yourself and other people. ? Remember that some people without symptoms may be able to spread virus. ? This is especially important for people who are  at higher risk of getting very RetroStamps.it Cover your mouth and nose with a cloth face cover when around others  You could spread COVID-19 to others even if you do not feel sick.  Everyone should wear a cloth face cover when they have to go out in public, for example to the grocery store or to pick up other necessities. ? Cloth face coverings should not be placed on young children under age 68, anyone who has trouble breathing, or is unconscious, incapacitated or otherwise unable to remove the mask without assistance.  The cloth face cover is meant to protect other people in case you are infected.  Do NOT use a facemask meant for a Research scientist (physical sciences).  Continue to keep about 6 feet between yourself and others. The cloth face cover is not a substitute for social distancing. Cover  coughs and sneezes  If you are in a private setting and do not have on your cloth face covering, remember to always cover your mouth and nose with a tissue when you cough or sneeze or use the inside of your elbow.  Throw used tissues in the trash.  Immediately wash your hands with soap and water for at least 20 seconds. If soap and water are not readily available, clean your hands with a hand sanitizer that contains at least 60% alcohol. Clean and disinfect  Clean AND disinfect frequently touched surfaces daily. This includes tables, doorknobs, light switches, countertops, handles, desks, phones, keyboards, toilets, faucets, and sinks. ktimeonline.com  If surfaces are dirty, clean them: Use detergent or soap and water prior to disinfection.  Then, use a household disinfectant. You can see a list of EPA-registered household disinfectants here. SouthAmericaFlowers.co.uk 09/09/2018 This information is not intended to replace advice given to you by your health care provider. Make sure you discuss any questions you have with your health care provider. Document Released: 08/19/2018 Document Revised: 09/17/2018 Document Reviewed: 08/19/2018 Elsevier Patient Education  2020 [employer].   COVID-19 COVID-19 is a respiratory infection that is caused by a virus called severe acute respiratory syndrome coronavirus 2 (SARS-CoV-2). The disease is also known as coronavirus disease or novel coronavirus. In some people, the virus may not cause any symptoms. In others, it may cause a serious infection. The infection can get worse quickly and can lead to complications, such as:  Pneumonia, or infection of the lungs.  Acute respiratory distress syndrome or ARDS. This is fluid build-up in the lungs.  Acute respiratory failure. This is a condition in which there is not enough oxygen passing from the lungs to the body.  Sepsis or septic shock.  This is a serious bodily reaction to an infection.  Blood clotting problems.  Secondary infections due to bacteria or fungus. The virus that causes COVID-19 is contagious. This means that it can spread from person to person through droplets from coughs and sneezes (respiratory secretions). What are the causes? This illness is caused by a virus. You may catch the virus by:  Breathing in droplets from an infected person's cough or sneeze.  Touching something, like a table or a doorknob, that was exposed to the virus (contaminated) and then touching your mouth, nose, or eyes. What increases the risk? Risk for infection You are more likely to be infected with this virus if you:  Live in or travel to an area with a COVID-19 outbreak.  Come in contact with a sick person who recently traveled to an area with a COVID-19 outbreak.  Provide care for  or live with a person who is infected with COVID-19. Risk for serious illness You are more likely to become seriously ill from the virus if you:  Are 59 years of age or older.  Have a long-term disease that lowers your body's ability to fight infection (immunocompromised).  Live in a nursing home or long-term care facility.  Have a long-term (chronic) disease such as: ? Chronic lung disease, including chronic obstructive pulmonary disease or asthma ? Heart disease. ? Diabetes. ? Chronic kidney disease. ? Liver disease.  Are obese. What are the signs or symptoms? Symptoms of this condition can range from mild to severe. Symptoms may appear any time from 2 to 14 days after being exposed to the virus. They include:  A fever.  A cough.  Difficulty breathing.  Chills.  Muscle pains.  A sore throat.  Loss of taste or smell. Some people may also have stomach problems, such as nausea, vomiting, or diarrhea. Other people may not have any symptoms of COVID-19. How is this diagnosed? This condition may be diagnosed based on:  Your  signs and symptoms, especially if: ? You live in an area with a COVID-19 outbreak. ? You recently traveled to or from an area where the virus is common. ? You provide care for or live with a person who was diagnosed with COVID-19.  A physical exam.  Lab tests, which may include: ? A nasal swab to take a sample of fluid from your nose. ? A throat swab to take a sample of fluid from your throat. ? A sample of mucus from your lungs (sputum). ? Blood tests.  Imaging tests, which may include, X-rays, CT scan, or ultrasound. How is this treated? At present, there is no medicine to treat COVID-19. Medicines that treat other diseases are being used on a trial basis to see if they are effective against COVID-19. Your health care provider will talk with you about ways to treat your symptoms. For most people, the infection is mild and can be managed at home with rest, fluids, and over-the-counter medicines. Treatment for a serious infection usually takes places in a hospital intensive care unit (ICU). It may include one or more of the following treatments. These treatments are given until your symptoms improve.  Receiving fluids and medicines through an IV.  Supplemental oxygen. Extra oxygen is given through a tube in the nose, a face mask, or a hood.  Positioning you to lie on your stomach (prone position). This makes it easier for oxygen to get into the lungs.  Continuous positive airway pressure (CPAP) or bi-level positive airway pressure (BPAP) machine. This treatment uses mild air pressure to keep the airways open. A tube that is connected to a motor delivers oxygen to the body.  Ventilator. This treatment moves air into and out of the lungs by using a tube that is placed in your windpipe.  Tracheostomy. This is a procedure to create a hole in the neck so that a breathing tube can be inserted.  Extracorporeal membrane oxygenation (ECMO). This procedure gives the lungs a chance to recover by  taking over the functions of the heart and lungs. It supplies oxygen to the body and removes carbon dioxide. Follow these instructions at home: Lifestyle  If you are sick, stay home except to get medical care. Your health care provider will tell you how long to stay home. Call your health care provider before you go for medical care.  Rest at home as told by  your health care provider.  Do not use any products that contain nicotine or tobacco, such as cigarettes, e-cigarettes, and chewing tobacco. If you need help quitting, ask your health care provider.  Return to your normal activities as told by your health care provider. Ask your health care provider what activities are safe for you. General instructions  Take over-the-counter and prescription medicines only as told by your health care provider.  Drink enough fluid to keep your urine pale yellow.  Keep all follow-up visits as told by your health care provider. This is important. How is this prevented?  There is no vaccine to help prevent COVID-19 infection. However, there are steps you can take to protect yourself and others from this virus. To protect yourself:   Do not travel to areas where COVID-19 is a risk. The areas where COVID-19 is reported change often. To identify high-risk areas and travel restrictions, check the CDC travel website: StageSync.si  If you live in, or must travel to, an area where COVID-19 is a risk, take precautions to avoid infection. ? Stay away from people who are sick. ? Wash your hands often with soap and water for 20 seconds. If soap and water are not available, use an alcohol-based hand sanitizer. ? Avoid touching your mouth, face, eyes, or nose. ? Avoid going out in public, follow guidance from your state and local health authorities. ? If you must go out in public, wear a cloth face covering or face mask. ? Disinfect objects and surfaces that are frequently touched every day. This  may include:  Counters and tables.  Doorknobs and light switches.  Sinks and faucets.  Electronics, such as phones, remote controls, keyboards, computers, and tablets. To protect others: If you have symptoms of COVID-19, take steps to prevent the virus from spreading to others.  If you think you have a COVID-19 infection, contact your health care provider right away. Tell your health care team that you think you may have a COVID-19 infection.  Stay home. Leave your house only to seek medical care. Do not use public transport.  Do not travel while you are sick.  Wash your hands often with soap and water for 20 seconds. If soap and water are not available, use alcohol-based hand sanitizer.  Stay away from other members of your household. Let healthy household members care for children and pets, if possible. If you have to care for children or pets, wash your hands often and wear a mask. If possible, stay in your own room, separate from others. Use a different bathroom.  Make sure that all people in your household wash their hands well and often.  Cough or sneeze into a tissue or your sleeve or elbow. Do not cough or sneeze into your hand or into the air.  Wear a cloth face covering or face mask. Where to find more information  Centers for Disease Control and Prevention: StickerEmporium.tn  World Health Organization: https://thompson-craig.com/ Contact a health care provider if:  You live in or have traveled to an area where COVID-19 is a risk and you have symptoms of the infection.  You have had contact with someone who has COVID-19 and you have symptoms of the infection. Get help right away if:  You have trouble breathing.  You have pain or pressure in your chest.  You have confusion.  You have bluish lips and fingernails.  You have difficulty waking from sleep.  You have symptoms that get worse. These symptoms may represent  a  serious problem that is an emergency. Do not wait to see if the symptoms will go away. Get medical help right away. Call your local emergency services (911 in the [place] Do not drive yourself to the hospital. Let the emergency medical personnel know if you think you have COVID-19. Summary  COVID-19 is a respiratory infection that is caused by a virus. It is also known as coronavirus disease or novel coronavirus. It can cause serious infections, such as pneumonia, acute respiratory distress syndrome, acute respiratory failure, or sepsis.  The virus that causes COVID-19 is contagious. This means that it can spread from person to person through droplets from coughs and sneezes.  You are more likely to develop a serious illness if you are 41 years of age or older, have a weak immunity, live in a nursing home, or have chronic disease.  There is no medicine to treat COVID-19. Your health care provider will talk with you about ways to treat your symptoms.  Take steps to protect yourself and others from infection. Wash your hands often and disinfect objects and surfaces that are frequently touched every day. Stay away from people who are sick and wear a mask if you are sick. This information is not intended to replace advice given to you by your health care provider. Make sure you discuss any questions you have with your health care provider. Document Released: 05/29/2018 Document Revised: 09/18/2018 Document Reviewed: 05/29/2018 Elsevier Patient Education  2020 [employer].   COVID-19 Frequently Asked Questions COVID-19 (coronavirus disease) is an infection that is caused by a large family of viruses. Some viruses cause illness in people and others cause illness in animals like camels, cats, and bats. In some cases, the viruses that cause illness in animals can spread to humans. Where did the coronavirus come from? In December 2019, [place] told the [employer] [facility]) of several cases of  lung disease (human respiratory illness). These cases were linked to an open seafood and livestock market in the city of [place]. The link to the seafood and livestock market suggests that the virus may have spread from animals to humans. However, since that first outbreak in December, the virus has also been shown to spread from person to person. What is the name of the disease and the virus? Disease name Early on, this disease was called novel coronavirus. This is because scientists determined that the disease was caused by a new (novel) respiratory virus. The World Health Organization [facility]) has now named the disease COVID-19, or coronavirus disease. Virus name The virus that causes the disease is called severe acute respiratory syndrome coronavirus 2 (SARS-CoV-2). More information on disease and virus naming World Health Organization [facility]): www.who.int/emergencies/diseases/novel-coronavirus-2019/technical-guidance/naming-the-coronavirus-disease-(covid-2019)-and-the-virus-that-causes-it Who is at risk for complications from coronavirus disease? Some people may be at higher risk for complications from coronavirus disease. This includes older adults and people who have chronic diseases, such as heart disease, diabetes, and lung disease. If you are at higher risk for complications, take these extra precautions:  Avoid close contact with people who are sick or have a fever or cough. Stay at least 3-6 ft (1-2 m) away from them, if possible.  Wash your hands often with soap and water for at least 20 seconds.  Avoid touching your face, mouth, nose, or eyes.  Keep supplies on hand at home, such as food, medicine, and cleaning supplies.  Stay home as much as possible.  Avoid social gatherings and travel. How does coronavirus disease spread? The  virus that causes coronavirus disease spreads easily from person to person (is contagious). There are also cases of community-spread disease. This means the  disease has spread to:  People who have no known contact with other infected people.  People who have not traveled to areas where there are known cases. It appears to spread from one person to another through droplets from coughing or sneezing. Can I get the virus from touching surfaces or objects? There is still a lot that we do not know about the virus that causes coronavirus disease. Scientists are basing a lot of information on what they know about similar viruses, such as:  Viruses cannot generally survive on surfaces for long. They need a human body (host) to survive.  It is more likely that the virus is spread by close contact with people who are sick (direct contact), such as through: ? Shaking hands or hugging. ? Breathing in respiratory droplets that travel through the air. This can happen when an infected person coughs or sneezes on or near other people.  It is less likely that the virus is spread when a person touches a surface or object that has the virus on it (indirect contact). The virus may be able to enter the body if the person touches a surface or object and then touches his or her face, eyes, nose, or mouth. Can a person spread the virus without having symptoms of the disease? It may be possible for the virus to spread before a person has symptoms of the disease, but this is most likely not the main way the virus is spreading. It is more likely for the virus to spread by being in close contact with people who are sick and breathing in the respiratory droplets of a sick person's cough or sneeze. What are the symptoms of coronavirus disease? Symptoms vary from person to person and can range from mild to severe. Symptoms may include:  Fever.  Cough.  Tiredness, weakness, or fatigue.  Fast breathing or feeling short of breath. These symptoms can appear anywhere from 2 to 14 days after you have been exposed to the virus. If you develop symptoms, call your health care  provider. People with severe symptoms may need hospital care. If I am exposed to the virus, how long does it take before symptoms start? Symptoms of coronavirus disease may appear anywhere from 2 to 14 days after a person has been exposed to the virus. If you develop symptoms, call your health care provider. Should I be tested for this virus? Your health care provider will decide whether to test you based on your symptoms, history of exposure, and your risk factors. How does a health care provider test for this virus? Health care providers will collect samples to send for testing. Samples may include:  Taking a swab of fluid from the nose.  Taking fluid from the lungs by having you cough up mucus (sputum) into a sterile cup.  Taking a blood sample.  Taking a stool or urine sample. Is there a treatment or vaccine for this virus? Currently, there is no vaccine to prevent coronavirus disease. Also, there are no medicines like antibiotics or antivirals to treat the virus. A person who becomes sick is given supportive care, which means rest and fluids. A person may also relieve his or her symptoms by using over-the-counter medicines that treat sneezing, coughing, and runny nose. These are the same medicines that a person takes for the common cold. If you develop symptoms,  call your health care provider. People with severe symptoms may need hospital care. What can I do to protect myself and my family from this virus?     You can protect yourself and your family by taking the same actions that you would take to prevent the spread of other viruses. Take the following actions:  Wash your hands often with soap and water for at least 20 seconds. If soap and water are not available, use alcohol-based hand sanitizer.  Avoid touching your face, mouth, nose, or eyes.  Cough or sneeze into a tissue, sleeve, or elbow. Do not cough or sneeze into your hand or the air. ? If you cough or sneeze into a  tissue, throw it away immediately and wash your hands.  Disinfect objects and surfaces that you frequently touch every day.  Avoid close contact with people who are sick or have a fever or cough. Stay at least 3-6 ft (1-2 m) away from them, if possible.  Stay home if you are sick, except to get medical care. Call your health care provider before you get medical care.  Make sure your vaccines are up to date. Ask your health care provider what vaccines you need. What should I do if I need to travel? Follow travel recommendations from your local health authority, the CDC, and WHO. Travel information and advice  Centers for Disease Control and Prevention (CDC): GeminiCard.glwww.cdc.gov/coronavirus/2019-ncov/travelers/index.html  World Health Organization [facility](WHO): PreviewDomains.sewww.who.int/emergencies/diseases/novel-coronavirus-2019/travel-advice Know the risks and take action to protect your health  You are at higher risk of getting coronavirus disease if you are traveling to areas with an outbreak or if you are exposed to travelers from areas with an outbreak.  Wash your hands often and practice good hygiene to lower the risk of catching or spreading the virus. What should I do if I am sick? General instructions to stop the spread of infection  Wash your hands often with soap and water for at least 20 seconds. If soap and water are not available, use alcohol-based hand sanitizer.  Cough or sneeze into a tissue, sleeve, or elbow. Do not cough or sneeze into your hand or the air.  If you cough or sneeze into a tissue, throw it away immediately and wash your hands.  Stay home unless you must get medical care. Call your health care provider or local health authority before you get medical care.  Avoid public areas. Do not take public transportation, if possible.  If you can, wear a mask if you must go out of the house or if you are in close contact with someone who is not sick. Keep your home clean  Disinfect  objects and surfaces that are frequently touched every day. This may include: ? Counters and tables. ? Doorknobs and light switches. ? Sinks and faucets. ? Electronics such as phones, remote controls, keyboards, computers, and tablets.  Wash dishes in hot, soapy water or use a dishwasher. Air-dry your dishes.  Wash laundry in hot water. Prevent infecting other household members  Let healthy household members care for children and pets, if possible. If you have to care for children or pets, wash your hands often and wear a mask.  Sleep in a different bedroom or bed, if possible.  Do not share personal items, such as razors, toothbrushes, deodorant, combs, brushes, towels, and washcloths. Where to find more information Centers for Disease Control and Prevention (CDC)  Information and news updates: CardRetirement.czwww.cdc.gov/coronavirus/2019-ncov World Health Organization [facility](WHO)  Information and news updates: AffordableSalon.eswww.who.int/emergencies/diseases/novel-coronavirus-2019  Coronavirus health topic: https://thompson-craig.com/www.who.int/health-topics/coronavirus  Questions and answers on COVID-19: kruiseway.comwww.who.int/news-room/q-a-detail/q-a-coronaviruses  Global tracker: who.sprinklr.com American Academy of Pediatrics (AAP)  Information for families: www.healthychildren.org/English/health-issues/conditions/chest-lungs/Pages/2019-Novel-Coronavirus.aspx The coronavirus situation is changing rapidly. Check your local health authority website or the CDC and [facility] websites for updates and news. When should I contact a health care provider?  Contact your health care provider if you have symptoms of an infection, such as fever or cough, and you: ? Have been near anyone who is known to have coronavirus disease. ? Have come into contact with a person who is suspected to have coronavirus disease. ? Have traveled outside of the country. When should I get emergency medical care?  Get help right away by calling your local emergency services (911 in  the [place] if you have: ? Trouble breathing. ? Pain or pressure in your chest. ? Confusion. ? Blue-tinged lips and fingernails. ? Difficulty waking from sleep. ? Symptoms that get worse. Let the emergency medical personnel know if you think you have coronavirus disease. Summary  A new respiratory virus is spreading from person to person and causing COVID-19 (coronavirus disease).  The virus that causes COVID-19 appears to spread easily. It spreads from one person to another through droplets from coughing or sneezing.  Older adults and those with chronic diseases are at higher risk of disease. If you are at higher risk for complications, take extra precautions.  There is currently no vaccine to prevent coronavirus disease. There are no medicines, such as antibiotics or antivirals, to treat the virus.  You can protect yourself and your family by washing your hands often, avoiding touching your face, and covering your coughs and sneezes. This information is not intended to replace advice given to you by your health care provider. Make sure you discuss any questions you have with your health care provider. Document Released: 08/19/2018 Document Revised: 08/19/2018 Document Reviewed: 08/19/2018 Elsevier Patient Education  2020 [employer]

## 2018-11-18 NOTE — Discharge Summary (Signed)
Physician Discharge Summary  Martin [name] WUJ:[id number] DOB: [birth year] DOA: 11/15/2018  PCP: Mirna Mires, MD  Admit date: 11/15/2018 Discharge date: 11/18/2018  Admitted From: home Disposition:  home  Recommendations for Outpatient Follow-up:  1. Follow up with PCP in 1-2 weeks  Home Health: none Equipment/Devices: none  Discharge Condition: stable CODE STATUS: Full code Diet recommendation: regular  HPI: Per admitting MD, Martin [name] is a 48 y.o. male with medical history significant for hypertension, hyperlipidemia, gout, presents to the ED complaining of fever, some productive cough of greenish/whitish sputum, malaise, and some subjective shortness of breath that has been ongoing for the past 11 days.  Patient had tried home remedies without any improvement.  About 2 days ago he went to an urgent care where he tested positive for COVID-19.  Patient denies any sick contacts, lives with his wife and kids, and none of them are sick.  Patient continues to also get progressively weak.  Patient denies any chest pain, abdominal pain, nausea/vomiting, diarrhea. ED Course: Patient noted to be febrile, with no leukocytosis, saturating well on room air.  Inflammatory markers elevated, creatinine elevated at 1.99, chest x-ray showed ill-defined opacity in the left midlung consistent with pneumonia.  Patient was given IV fluids.  [facility] hospitalist called to admit patient for [facility] Course: COVID-19 viral illness/lobar pneumonia -at the time of discharge patient has been having nonspecific viral symptoms for the past 14 days.  He remained stable while hospitalized, he did have elevated inflammatory markers for which she was started on steroids and are now improving.  He was never hypoxic and has remained on room air at all times.  Given length of symptoms and productive cough, it is certainly possible that he may have developed bacterial pneumonia as a secondary infection, he was placed  on antibiotics and he is to continue for a few more days following discharge.  He developed leukocytosis immediately after starting steroids and this is likely related to that.  He is afebrile, stable, able to ambulate without difficulties, and will be discharged home in stable condition.  COVID-19 Labs  Recent Labs    11/16/18 1030 11/17/18 0215 11/18/18 0217  DDIMER 0.54* 0.43 0.28  FERRITIN 1,515* 1,421* 877*  LDH 357* 396* 347*  CRP 11.4* 7.8* 3.5*    Lab Results  Component Value Date   SARSCOV2NAA POSITIVE (A) 11/15/2018   Acute kidney injury on probable chronic kidney disease, unknown stage-patient reports mild kidney impairment that he is not sure of his baseline creatinine.  He is creatinine was 1.99 on admission, his home spironolactone, chlorthalidone and losartan were held, and with supportive treatment his creatinine improved to 1.38 on discharge, similar to most recent creatinine in 2017.  Hypertension -patient's blood pressure has been normal on amlodipine alone while hospitalized.  His spironolactone, losartan, chlorthalidone and hydralazine were held.  Given normal blood pressure will discharge patient on amlodipine alone.  He does have a blood pressure cuff at home and will continue to monitor, he was advised to reintroduce hydralazine if his systolic blood pressure is persistently above 140, and if hydralazine is not enough to control blood pressure to call his PCP for further labs  Morbid obesity -patient will significantly benefit from weight loss  Hyperlipidemia -continue home medications  Discharge Diagnoses:  Principal Problem:   COVID-19 virus infection Active Problems:   Essential hypertension   Discharge Instructions  Allergies as of 11/18/2018      Reactions   Benazepril Hcl  Swelling   Swelling of the lips      Medication List    STOP taking these medications   chlorthalidone 25 MG tablet Commonly known as: HYGROTON   hydrALAZINE 50 MG  tablet Commonly known as: APRESOLINE   losartan 100 MG tablet Commonly known as: COZAAR   meloxicam 15 MG tablet Commonly known as: MOBIC   omeprazole 20 MG capsule Commonly known as: PRILOSEC     TAKE these medications   acetaminophen 500 MG tablet Commonly known as: TYLENOL Take 1,000 mg by mouth every 6 (six) hours as needed for pain.   amLODipine 10 MG tablet Commonly known as: NORVASC Take 10 mg by mouth daily.   cefdinir 300 MG capsule Commonly known as: OMNICEF Take 1 capsule (300 mg total) by mouth 2 (two) times daily.   clotrimazole-betamethasone cream Commonly known as: LOTRISONE Apply 1 application topically daily as needed.   dexamethasone 6 MG tablet Commonly known as: DECADRON Take 1 tablet (6 mg total) by mouth daily. Start taking on: November 19, 2018   rosuvastatin 10 MG tablet Commonly known as: CRESTOR Take 10 mg by mouth daily.   spironolactone 25 MG tablet Commonly known as: ALDACTONE Take 25 mg by mouth daily.      Follow-up Information    Iona Beard, MD. Schedule an appointment as soon as possible for a visit in 2 week(s).   Specialty: Family Medicine Contact information: [address] [place] [address] [phone]           Consultations:  None   Procedures/Studies:  Dg Chest Port 1 View  Result Date: 11/17/2018 CLINICAL DATA:  Cough EXAM: PORTABLE CHEST 1 VIEW COMPARISON:  11/15/2018 FINDINGS: There is mild bilateral interstitial prominence. There is no focal consolidation. There is no pleural effusion or pneumothorax. The heart and mediastinal contours are unremarkable. There is no acute osseous abnormality. IMPRESSION: Bilateral mild interstitial thickening which may reflect interstitial infection versus interstitial edema. Electronically Signed   By: Kathreen Devoid   On: 11/17/2018 11:55   Dg Chest Port 1 View  Result Date: 11/15/2018 CLINICAL DATA:  Shortness of breath.  Positive COVID-19 EXAM: PORTABLE CHEST 1  VIEW COMPARISON:  August 12, 2012 FINDINGS: There is ill-defined opacity in the left mid lung. Lungs elsewhere clear. Heart is borderline enlarged with pulmonary vascularity normal. No adenopathy. No bone lesions. IMPRESSION: Ill-defined airspace opacity left mid lung consistent with pneumonia. Lungs elsewhere clear. Heart prominent period no adenopathy evident. Electronically Signed   By: Lowella Grip III M.D.   On: 11/15/2018 12:15     Subjective: - no chest pain, shortness of breath, no abdominal pain, nausea or vomiting.   Discharge Exam: BP 124/70 (BP Location: Left Arm)    Pulse (!) 55    Temp 98 F (36.7 C) (Oral)    Resp 14    Ht 6\' 4"  (1.93 m)    Wt (!) 157 kg    SpO2 95%    BMI 42.13 kg/m   General: Pt is alert, awake, not in acute distress Cardiovascular: RRR, S1/S2 +, no rubs, no gallops Respiratory: CTA bilaterally, no wheezing, no rhonchi Abdominal: Soft, NT, ND, bowel sounds + Extremities: no edema, no cyanosis  The results of significant diagnostics from this hospitalization (including imaging, microbiology, ancillary and laboratory) are listed below for reference.     Microbiology: Recent Results (from the past 240 hour(s))  SARS Coronavirus 2 [facility] order, Performed in [facility] hospital lab)  Status: Abnormal   Collection Time: 11/15/18  1:16 PM   Specimen: Nasopharyngeal Swab  Result Value Ref Range Status   SARS Coronavirus 2 POSITIVE (A) NEGATIVE Final    Comment: RESULT CALLED TO, READ BACK BY AND VERIFIED WITH: Mignon PineANGELA BULLOCK,RN [id number] @ 1749 BY J SCOTTON (NOTE) If result is NEGATIVE SARS-CoV-2 target nucleic acids are NOT DETECTED. The SARS-CoV-2 RNA is generally detectable in upper and lower  respiratory specimens during the acute phase of infection. The lowest  concentration of SARS-CoV-2 viral copies this assay can detect is 250  copies / mL. A negative result does not preclude SARS-CoV-2 infection  and should not be used as the sole basis  for treatment or other  patient management decisions.  A negative result may occur with  improper specimen collection / handling, submission of specimen other  than nasopharyngeal swab, presence of viral mutation(s) within the  areas targeted by this assay, and inadequate number of viral copies  (<250 copies / mL). A negative result must be combined with clinical  observations, patient history, and epidemiological information. If result is POSITIVE SARS-CoV-2 target nucleic acids are DET ECTED. The SARS-CoV-2 RNA is generally detectable in upper and lower  respiratory specimens during the acute phase of infection.  Positive  results are indicative of active infection with SARS-CoV-2.  Clinical  correlation with patient history and other diagnostic information is  necessary to determine patient infection status.  Positive results do  not rule out bacterial infection or co-infection with other viruses. If result is PRESUMPTIVE POSTIVE SARS-CoV-2 nucleic acids MAY BE PRESENT.   A presumptive positive result was obtained on the submitted specimen  and confirmed on repeat testing.  While 2019 novel coronavirus  (SARS-CoV-2) nucleic acids may be present in the submitted sample  additional confirmatory testing may be necessary for epidemiological  and / or clinical management purposes  to differentiate between  SARS-CoV-2 and other Sarbecovirus currently known to infect humans.  If clinically indicated additional testing with an alternate test  methodology ([phone]) is advised. The SARS-CoV-2 RNA is generally  detectable in upper and lower respiratory specimens during the acute  phase of infection. The expected result is Negative. Fact Sheet for Patients:  BoilerBrush.com.cyhttps://www.fda.gov/media/136312/download Fact Sheet for Healthcare Providers: https://pope.com/https://www.fda.gov/media/136313/download This test is not yet approved or cleared by the [place] FDA and has been authorized for detection and/or  diagnosis of SARS-CoV-2 by FDA under an Emergency Use Authorization (EUA).  This EUA will remain in effect (meaning this test can be used) for the duration of the COVID-19 declaration under Section 564(b)(1) of the Act, 21 U.S.C. section 360bbb-3(b)(1), unless the authorization is terminated or revoked sooner. Performed at [facility], [address] [place], [place] [id number]      Labs: BNP (last 3 results) No results for input(s): BNP in the last 8760 hours. Basic Metabolic Panel: Recent Labs  Lab 11/15/18 1134 11/16/18 1030 11/17/18 0215 11/18/18 0217  NA 135 136 139 138  K 3.4* 3.6 4.0 3.9  CL 96* 99 100 100  CO2 25 25 23 24   GLUCOSE 111* 194* 203* 158*  BUN 30* 33* 40* 42*  CREATININE 1.99* 1.61* 1.71* 1.38*  CALCIUM 9.0 9.1 9.5 8.6*   Liver Function Tests: Recent Labs  Lab 11/15/18 1134 11/16/18 1030 11/17/18 0215  AST 60* 80* 85*  ALT 62* 94* 112*  ALKPHOS 52 54 57  BILITOT 0.8 0.6 0.3  PROT 8.4* 8.3* 8.7*  ALBUMIN 4.5 4.1 4.5  Recent Labs  Lab 11/15/18 1134  LIPASE 31   No results for input(s): AMMONIA in the last 168 hours. CBC: Recent Labs  Lab 11/15/18 1134 11/15/18 1335 11/16/18 1030 11/17/18 0215 11/18/18 0217  WBC 8.4  --  10.3 20.8* 21.0*  NEUTROABS 7.1  --   --   --   --   HGB 13.7 12.7* 13.8 13.9 12.6*  HCT 41.7  --  42.2 44.0 40.3  MCV 80.0  --  79.0* 80.6 80.8  PLT 226  --  259 324 313   Cardiac Enzymes: No results for input(s): CKTOTAL, CKMB, CKMBINDEX, TROPONINI in the last 168 hours. BNP: Invalid input(s): POCBNP CBG: No results for input(s): GLUCAP in the last 168 hours. D-Dimer Recent Labs    11/17/18 0215 11/18/18 0217  DDIMER 0.43 0.28   Hgb A1c No results for input(s): HGBA1C in the last 72 hours. Lipid Profile Recent Labs    11/15/18 1336  TRIG 105   Thyroid function studies No results for input(s): TSH, T4TOTAL, T3FREE, THYROIDAB in the last 72 hours.  Invalid input(s):  FREET3 Anemia work up Recent Labs    11/17/18 0215 11/18/18 0217  FERRITIN 1,421* 877*   Urinalysis    Component Value Date/Time   COLORURINE YELLOW 11/10/2015 2041   APPEARANCEUR CLEAR 11/10/2015 2041   LABSPEC 1.029 11/10/2015 2041   PHURINE 5.5 11/10/2015 2041   GLUCOSEU NEGATIVE 11/10/2015 2041   HGBUR NEGATIVE 11/10/2015 2041   BILIRUBINUR SMALL (A) 11/10/2015 2041   KETONESUR NEGATIVE 11/10/2015 2041   PROTEINUR 100 (A) 11/10/2015 2041   NITRITE NEGATIVE 11/10/2015 2041   LEUKOCYTESUR NEGATIVE 11/10/2015 2041   Sepsis Labs Invalid input(s): PROCALCITONIN,  WBC,  LACTICIDVEN  FURTHER DISCHARGE INSTRUCTIONS:   Get Medicines reviewed and adjusted: Please take all your medications with you for your next visit with your Primary MD   Laboratory/radiological data: Please request your Primary MD to go over all hospital tests and procedure/radiological results at the follow up, please ask your Primary MD to get all Hospital records sent to his/her office.   In some cases, they will be blood work, cultures and biopsy results pending at the time of your discharge. Please request that your primary care M.D. goes through all the records of your hospital data and follows up on these results.   Also Note the following: If you experience worsening of your admission symptoms, develop shortness of breath, life threatening emergency, suicidal or homicidal thoughts you must seek medical attention immediately by calling 911 or calling your MD immediately  if symptoms less severe.   You must read complete instructions/literature along with all the possible adverse reactions/side effects for all the Medicines you take and that have been prescribed to you. Take any new Medicines after you have completely understood and accpet all the possible adverse reactions/side effects.    Do not drive when taking Pain medications or sleeping medications (Benzodaizepines)   Do not take more than  prescribed Pain, Sleep and Anxiety Medications. It is not advisable to combine anxiety,sleep and pain medications without talking with your primary care practitioner   Special Instructions: If you have smoked or chewed Tobacco  in the last 2 yrs please stop smoking, stop any regular Alcohol  and or any Recreational drug use.   Wear Seat belts while driving.   Please note: You were cared for by a hospitalist during your hospital stay. Once you are discharged, your primary care physician will handle any further medical issues. Please  note that NO REFILLS for any discharge medications will be authorized once you are discharged, as it is imperative that you return to your primary care physician (or establish a relationship with a primary care physician if you do not have one) for your post hospital discharge needs so that they can reassess your need for medications and monitor your lab values.  Time coordinating discharge: 40 minutes  SIGNED:  Pamella Pertostin Aidee Latimore, MD, PhD 11/18/2018, 10:19 AM

## 2018-11-27 ENCOUNTER — Ambulatory Visit: Payer: Self-pay | Admitting: Cardiology

## 2018-11-27 ENCOUNTER — Other Ambulatory Visit: Payer: Self-pay

## 2018-11-27 ENCOUNTER — Ambulatory Visit: Payer: Managed Care, Other (non HMO) | Admitting: Cardiology

## 2018-11-27 MED ORDER — SPIRONOLACTONE 25 MG PO TABS: 25.0000 mg | ORAL_TABLET | Freq: Every day | ORAL | 1 refills | Status: DC

## 2018-12-09 ENCOUNTER — Ambulatory Visit
Admission: RE | Admit: 2018-12-09 | Discharge: 2018-12-09 | Disposition: A | Discharge status: Home / Self Care | Payer: Managed Care, Other (non HMO) | Source: Ambulatory Visit | Attending: Family Medicine | Admitting: Family Medicine

## 2018-12-09 ENCOUNTER — Other Ambulatory Visit: Payer: Self-pay | Admitting: Family Medicine

## 2018-12-09 DIAGNOSIS — J129 Viral pneumonia, unspecified: Secondary | ICD-10-CM

## 2018-12-18 ENCOUNTER — Other Ambulatory Visit: Payer: Self-pay

## 2018-12-18 ENCOUNTER — Encounter (INDEPENDENT_AMBULATORY_CARE_PROVIDER_SITE_OTHER): Payer: Self-pay

## 2018-12-18 ENCOUNTER — Encounter: Payer: Self-pay | Admitting: Cardiology

## 2018-12-18 ENCOUNTER — Ambulatory Visit (INDEPENDENT_AMBULATORY_CARE_PROVIDER_SITE_OTHER): Payer: Managed Care, Other (non HMO) | Admitting: Cardiology

## 2018-12-18 VITALS — BP 143/89 | HR 74 | Ht 76.0 in | Wt 358.0 lb

## 2018-12-18 DIAGNOSIS — R7989 Other specified abnormal findings of blood chemistry: Secondary | ICD-10-CM

## 2018-12-18 DIAGNOSIS — R945 Abnormal results of liver function studies: Secondary | ICD-10-CM | POA: Diagnosis not present

## 2018-12-18 DIAGNOSIS — U071 COVID-19: Secondary | ICD-10-CM

## 2018-12-18 DIAGNOSIS — Z6841 Body Mass Index (BMI) 40.0 and over, adult: Secondary | ICD-10-CM

## 2018-12-18 DIAGNOSIS — E7800 Pure hypercholesterolemia, unspecified: Secondary | ICD-10-CM

## 2018-12-18 DIAGNOSIS — I10 Essential (primary) hypertension: Secondary | ICD-10-CM | POA: Diagnosis not present

## 2018-12-18 MED ORDER — LOSARTAN POTASSIUM 100 MG PO TABS: 100.0000 mg | ORAL_TABLET | Freq: Every evening | ORAL | 3 refills | Status: DC

## 2018-12-18 NOTE — Progress Notes (Signed)
Primary Physician/Referring:  Iona Beard, MD  Patient ID: Martin [name], male    DOB: August 01, 1970, 48 y.o.   MRN: [id number]  Chief Complaint  Patient presents with  . Hypertension  . Hyperlipidemia  . Follow-up   HPI:    HPI: Martin SCHLAFER  is a 48 y.o. African-American male with history of difficult to control hypertension, morbid obesity, hypercholesterolemia, obstructive sleep apnea on CPAP who I had last seen a year ago for hypertension and blood pressure was well controlled, he has had a negative nuclear stress test and other echocardiogram had revealed normal LVEF with mild diastolic dysfunction in 4332.    He made an appointment to see me back to review his medications and follow-up for hypertension and hyperlipidemia. He was admitted to the hospital on 11/15/2018 with Covid 19 pneumonia, which is recuperated well.  His lost about 2014 rate, but there really started to gain some weight back.  Past Medical History:  Diagnosis Date  . Gout   . Hyperlipidemia   . Hypertension   . Renal disorder    Past Surgical History:  Procedure Laterality Date  . BACK SURGERY     Social History   Socioeconomic History  . Marital status: Married    Spouse name: Jonelle Sidle  . Number of children: 4  . Years of education: 44  . Highest education level: High school graduate  Occupational History  . Not on file  Social Needs  . Financial resource strain: Not hard at all  . Food insecurity    Worry: Never true    Inability: Never true  . Transportation needs    Medical: No    Non-medical: No  Tobacco Use  . Smoking status: Never Smoker  . Smokeless tobacco: Never Used  Substance and Sexual Activity  . Alcohol use: Yes  . Drug use: No  . Sexual activity: Yes  Lifestyle  . Physical activity    Days per week: 0 days    Minutes per session: 0 min  . Stress: Not at all  Relationships  . Social Herbalist on phone: Twice a week    Gets together: Twice a week   Attends religious service: More than 4 times per year    Active member of club or organization: Yes    Attends meetings of clubs or organizations: 1 to 4 times per year    Relationship status: Married  . Intimate partner violence    Fear of current or ex partner: No    Emotionally abused: No    Physically abused: No    Forced sexual activity: No  Other Topics Concern  . Not on file  Social History Narrative  . Not on file   ROS  Review of Systems  Constitution: Negative for chills, decreased appetite, malaise/fatigue and weight gain.  Cardiovascular: Negative for dyspnea on exertion, leg swelling and syncope.  Respiratory: Positive for snoring (OSA onn CPAP).   Endocrine: Negative for cold intolerance.  Hematologic/Lymphatic: Does not bruise/bleed easily.  Musculoskeletal: Positive for joint pain. Negative for joint swelling.  Gastrointestinal: Negative for abdominal pain, anorexia, change in bowel habit, hematochezia and melena.  Neurological: Negative for headaches and light-headedness.  Psychiatric/Behavioral: Negative for depression and substance abuse.  All other systems reviewed and are negative.  Objective  Blood pressure (!) 143/89, pulse 74, height 6' 4"  (1.93 m), weight (!) 358 lb (162.4 kg), SpO2 99 %. Body mass index is 43.58 kg/m.   Physical Exam  Constitutional: He appears well-developed. No distress.  Morbidly obese  HENT:  Head: Atraumatic.  Eyes: Conjunctivae are normal.  Neck: Neck supple. No thyromegaly present.  Short neck and difficult to evaluate JVP  Cardiovascular: Normal rate, regular rhythm and normal heart sounds. Exam reveals no gallop.  No murmur heard. Pulses:      Carotid pulses are 2+ on the right side and 2+ on the left side.      Dorsalis pedis pulses are 2+ on the right side and 2+ on the left side.       Posterior tibial pulses are 2+ on the right side and 2+ on the left side.  Femoral and popliteal pulse difficult to feel due to  patient's body habitus.   Pulmonary/Chest: Effort normal and breath sounds normal.  Abdominal: Soft. Bowel sounds are normal.  Obese. Pannus present  Musculoskeletal: Normal range of motion.        General: No edema.  Neurological: He is alert.  Skin: Skin is warm and dry.  Psychiatric: He has a normal mood and affect.   Radiology: No results found.  Laboratory examination:   CMP Latest Ref Rng & Units 11/18/2018 11/17/2018 11/16/2018  Glucose 70 - 99 mg/dL 158(H) 203(H) 194(H)  BUN 6 - 20 mg/dL 42(H) 40(H) 33(H)  Creatinine 0.61 - 1.24 mg/dL 1.38(H) 1.71(H) 1.61(H)  Sodium 135 - 145 mmol/L 138 139 136  Potassium 3.5 - 5.1 mmol/L 3.9 4.0 3.6  Chloride 98 - 111 mmol/L 100 100 99  CO2 22 - 32 mmol/L 24 23 25   Calcium 8.9 - 10.3 mg/dL 8.6(L) 9.5 9.1  Total Protein 6.5 - 8.1 g/dL - 8.7(H) 8.3(H)  Total Bilirubin 0.3 - 1.2 mg/dL - 0.3 0.6  Alkaline Phos 38 - 126 U/L - 57 54  AST 15 - 41 U/L - 85(H) 80(H)  ALT 0 - 44 U/L - 112(H) 94(H)   CBC Latest Ref Rng & Units 11/18/2018 11/17/2018 11/16/2018  WBC 4.0 - 10.5 K/uL 21.0(H) 20.8(H) 10.3  Hemoglobin 13.0 - 17.0 g/dL 12.6(L) 13.9 13.8  Hematocrit 39.0 - 52.0 % 40.3 44.0 42.2  Platelets 150 - 400 K/uL 313 324 259   Lipid Panel   Labs 06/06/2017: Serum glucose 99 mg, BUN 21, creatinine 1.4, eGFR date then 60 mL. Sodium 146, potassium 4.1. AST minimally elevated at 50, ALT normal. Otherwise CMP normal.  Labs 05/15/2017: Total cholesterol 278, triglycerides 101, HDL 56, LDL 200. Non-HDL cholesterol 122. BUN 25, creatinine 1.42, eGFR 69 mL, potassium 3.9. AST 74, ALT 45.  May 2016- Cholesterol-264, LDL-182, HDL-50, triglycerides-159.     Component Value Date/Time   TRIG 105 11/15/2018 1336   HEMOGLOBIN A1C No results found for: HGBA1C, MPG TSH No results for input(s): TSH in the last 8760 hours. Medications   Current Outpatient Medications  Medication Instructions  . acetaminophen (TYLENOL) 1,000 mg, Every 6 hours PRN  .  amLODipine (NORVASC) 10 mg, Daily  . clotrimazole-betamethasone (LOTRISONE) cream 1 application, Topical, Daily PRN  . hydrALAZINE (APRESOLINE) 50 MG tablet 1 tablet, Oral, 3 times daily PRN, For SBP > 140 mm Hg  . losartan (COZAAR) 100 mg, Oral, Every evening  . rosuvastatin (CRESTOR) 10 mg, Oral, Daily  . spironolactone (ALDACTONE) 25 mg, Oral, Daily   Cardiac Studies:  Exercise sestamibi stress test 06/24/2017: 1. The patient performed treadmill exercise using a Bruce protocol, completing 7:34 minutes. The patient completed an estimated workload of 9.45 METS, reaching 89% of the maximum predicted heart rate. The patient did  not develop symptoms other than fatigue during the procedure. Resting hypertension with normal blood pressure response at peak exercise. 2. The overall quality of the study is good. Left ventricular cavity is noted to be enlarged on the rest and stress studies. Review of the raw data in a rotational cine format reveals diaphragmatic attenuation. Gated SPECT images reveal normal myocardial thickening and wall motion. The left ventricular ejection fraction was calculated or visually estimated to be 61%. SPECT images demonstrate small perfusion abnormality of mild intensity in the mid inferolateral myocardial wall(s) on the stress images. Defects could be related to diaphragmatic attenuation.  Echocardiogram 06/24/2017: Left ventricle cavity is normal in size. Mild concentric hypertrophy of the left ventricle. Normal global wall motion. Doppler evidence of grade I (impaired) diastolic dysfunction, normal LAP. Calculated EF 70%. Left atrial cavity is mildly dilated. PFO reported on previous study on 06/02/2015, not well appreciated on this study. Consider agitated saline contrast study, if clinically indicated. Mild tricuspid regurgitation. Estimated pulmonary artery systolic pressure 36 mmHg. Compared to previous echocardiogram dated 06/02/2015, pulmonary hypertension is new.   Assessment     ICD-10-CM   1. Essential hypertension  I10 EKG [id number]    Basic metabolic panel  2. Hypercholesterolemia  E78.00   3. Class 3 severe obesity due to excess calories without serious comorbidity with body mass index (BMI) of 40.0 to 44.9 in adult (HCC)  E66.01    Z68.41   4. Abnormal LFTs  R94.5   5. COVID-19 virus infection  U07.1 Temperature monitoring  EKG 12/18/2018: Normal sinus rhythm with rate of 74 bpm, normal axis.  No evidence of ischemia, normal EKG.   Recommendations:   During hospitalization with Covid 19, July 2020, due to low blood pressure his medications were changed but now as he is been gaining weight, blood pressure has trended upwards.  I have restarted losartan.  I have discussed with him regarding his PMI, weight loss.  Also discussed the importance of continuing lipid therapy, longest being managed by his PCP. He has had recent lipid testing and he'll bring me the labs. I would like to see him back in 6 weeks for follow-up of hypertension.  Adrian Prows, MD, [facility] 12/20/2018, 9:31 AM [facility]. [place] Pager: [phone] Office: [phone] If no answer Cell [phone]

## 2018-12-25 ENCOUNTER — Other Ambulatory Visit: Payer: Self-pay

## 2018-12-25 MED ORDER — ROSUVASTATIN CALCIUM 10 MG PO TABS: 10.0000 mg | ORAL_TABLET | Freq: Every day | ORAL | 3 refills | Status: AC

## 2019-02-04 ENCOUNTER — Other Ambulatory Visit: Payer: Self-pay

## 2019-02-04 ENCOUNTER — Encounter: Payer: Self-pay | Admitting: Cardiology

## 2019-02-04 ENCOUNTER — Ambulatory Visit (INDEPENDENT_AMBULATORY_CARE_PROVIDER_SITE_OTHER): Payer: Managed Care, Other (non HMO) | Admitting: Cardiology

## 2019-02-04 VITALS — BP 145/88 | HR 72 | Temp 98.0°F | Ht 76.0 in | Wt 362.2 lb

## 2019-02-04 DIAGNOSIS — I129 Hypertensive chronic kidney disease with stage 1 through stage 4 chronic kidney disease, or unspecified chronic kidney disease: Secondary | ICD-10-CM

## 2019-02-04 DIAGNOSIS — Z6841 Body Mass Index (BMI) 40.0 and over, adult: Secondary | ICD-10-CM

## 2019-02-04 DIAGNOSIS — R945 Abnormal results of liver function studies: Secondary | ICD-10-CM | POA: Diagnosis not present

## 2019-02-04 DIAGNOSIS — E7800 Pure hypercholesterolemia, unspecified: Secondary | ICD-10-CM

## 2019-02-04 DIAGNOSIS — N183 Chronic kidney disease, stage 3 unspecified: Secondary | ICD-10-CM

## 2019-02-04 DIAGNOSIS — I10 Essential (primary) hypertension: Secondary | ICD-10-CM

## 2019-02-04 DIAGNOSIS — R7989 Other specified abnormal findings of blood chemistry: Secondary | ICD-10-CM

## 2019-02-04 NOTE — Progress Notes (Signed)
Primary Physician/Referring:  Iona Beard, MD  Patient ID: Martin [name], male    DOB: 03/03/1971, 48 y.o.   MRN: [id number]  Chief Complaint  Patient presents with   Hypertension   Results    Labs   Follow-up    6wk   HPI:    HPI: Martin [name]  is a 48 y.o. African-American male with history of difficult to control hypertension, morbid obesity, hypercholesterolemia, obstructive sleep apnea on CPAP,  Martin [name]  is a 48 y.o. African-American male with history of difficult to control hypertension, morbid obesity, hypercholesterolemia, obstructive sleep apnea on CPAP, admitted to the hospital on 11/15/2018 with Covid 19 pneumonia, which is recuperated well.  He was seen by me 6 weeks ago as all his blood pressure medications were discontinued due to hypotension and he had developed uncontrolled hypertension.  Past Medical History:  Diagnosis Date   Gout    Hyperlipidemia    Hypertension    Renal disorder    Past Surgical History:  Procedure Laterality Date   BACK SURGERY     Social History   Socioeconomic History   Marital status: Married    Spouse name: Jonelle Sidle   Number of children: 4   Years of education: 12   Highest education level: High school graduate  Occupational History   Not on file  Social Needs   Financial resource strain: Not hard at all   Food insecurity    Worry: Never true    Inability: Never true   Transportation needs    Medical: No    Non-medical: No  Tobacco Use   Smoking status: Never Smoker   Smokeless tobacco: Never Used  Substance and Sexual Activity   Alcohol use: Yes   Drug use: No   Sexual activity: Yes  Lifestyle   Physical activity    Days per week: 0 days    Minutes per session: 0 min   Stress: Not at all  Relationships   Social connections    Talks on phone: Twice a week    Gets together: Twice a week    Attends religious service: More than 4 times per year    Active member of club or  organization: Yes    Attends meetings of clubs or organizations: 1 to 4 times per year    Relationship status: Married   Intimate partner violence    Fear of current or ex partner: No    Emotionally abused: No    Physically abused: No    Forced sexual activity: No  Other Topics Concern   Not on file  Social History Narrative   Not on file   ROS  Review of Systems  Constitution: Negative for chills, decreased appetite, malaise/fatigue and weight gain.  Cardiovascular: Negative for dyspnea on exertion, leg swelling and syncope.  Respiratory: Positive for snoring (OSA onn CPAP).   Endocrine: Negative for cold intolerance.  Hematologic/Lymphatic: Does not bruise/bleed easily.  Musculoskeletal: Positive for joint pain. Negative for joint swelling.  Gastrointestinal: Negative for abdominal pain, anorexia, change in bowel habit, hematochezia and melena.  Neurological: Negative for headaches and light-headedness.  Psychiatric/Behavioral: Negative for depression and substance abuse.  All other systems reviewed and are negative.  Objective    Vitals with BMI 02/04/2019 12/18/2018 11/18/2018  Height _0  _1  -  Weight 362 lbs 3 oz 358 lbs -  BMI 20.35 59.7 -  Systolic 416 384 536  Diastolic 88 89 70  Pulse 72 74  55    Physical Exam  Constitutional: He appears well-developed. No distress.  Morbidly obese  HENT:  Head: Atraumatic.  Eyes: Conjunctivae are normal.  Neck: Neck supple. No thyromegaly present.  Short neck and difficult to evaluate JVP  Cardiovascular: Normal rate, regular rhythm and normal heart sounds. Exam reveals no gallop.  No murmur heard. Pulses:      Carotid pulses are 2+ on the right side and 2+ on the left side.      Dorsalis pedis pulses are 2+ on the right side and 2+ on the left side.       Posterior tibial pulses are 2+ on the right side and 2+ on the left side.  Femoral and popliteal pulse difficult to feel due to patient's body habitus.     Pulmonary/Chest: Effort normal and breath sounds normal.  Abdominal: Soft. Bowel sounds are normal.  Obese. Pannus present  Musculoskeletal: Normal range of motion.        General: No edema.  Neurological: He is alert.  Skin: Skin is warm and dry.  Psychiatric: He has a normal mood and affect.   Radiology: No results found.  Laboratory examination:   CMP Latest Ref Rng & Units 11/18/2018 11/17/2018 11/16/2018  Glucose 70 - 99 mg/dL 158(H) 203(H) 194(H)  BUN 6 - 20 mg/dL 42(H) 40(H) 33(H)  Creatinine 0.61 - 1.24 mg/dL 1.38(H) 1.71(H) 1.61(H)  Sodium 135 - 145 mmol/L 138 139 136  Potassium 3.5 - 5.1 mmol/L 3.9 4.0 3.6  Chloride 98 - 111 mmol/L 100 100 99  CO2 22 - 32 mmol/L _0 Calcium 8.9 - 10.3 mg/dL 8.6(L) 9.5 9.1  Total Protein 6.5 - 8.1 g/dL - 8.7(H) 8.3(H)  Total Bilirubin 0.3 - 1.2 mg/dL - 0.3 0.6  Alkaline Phos 38 - 126 U/L - 57 54  AST 15 - 41 U/L - 85(H) 80(H)  ALT 0 - 44 U/L - 112(H) 94(H)   CBC Latest Ref Rng & Units 11/18/2018 11/17/2018 11/16/2018  WBC 4.0 - 10.5 K/uL 21.0(H) 20.8(H) 10.3  Hemoglobin 13.0 - 17.0 g/dL 12.6(L) 13.9 13.8  Hematocrit 39.0 - 52.0 % 40.3 44.0 42.2  Platelets 150 - 400 K/uL 313 324 259   Lipid Panel     Component Value Date/Time   TRIG 105 11/15/2018 1336     Labs 06/06/2017: Serum glucose 99 mg, BUN 21, creatinine 1.4, eGFR date then 60 mL. Sodium 146, potassium 4.1. AST minimally elevated at 50, ALT normal. Otherwise CMP normal.  Labs 05/15/2017: Total cholesterol 278, triglycerides 101, HDL 56, LDL 200. Non-HDL cholesterol 122. BUN 25, creatinine 1.42, eGFR 69 mL, potassium 3.9. AST 74, ALT 45.  May 2016- Cholesterol-264, LDL-182, HDL-50, triglycerides-159.     Component Value Date/Time   TRIG 105 11/15/2018 1336   HEMOGLOBIN A1C No results found for: HGBA1C, MPG TSH No results for input(s): TSH in the last 8760 hours. Medications   Current Outpatient Medications  Medication Instructions   acetaminophen (TYLENOL)  1,000 mg, Every 6 hours PRN   amLODipine (NORVASC) 10 mg, Daily   clotrimazole-betamethasone (LOTRISONE) cream 1 application, Topical, Daily PRN   hydrALAZINE (APRESOLINE) 50 MG tablet 1 tablet, Oral, 3 times daily PRN, For SBP > 140 mm Hg   losartan (COZAAR) 100 mg, Oral, Every evening   rosuvastatin (CRESTOR) 10 mg, Oral, Daily   spironolactone (ALDACTONE) 25 mg, Oral, Daily   Cardiac Studies:   Exercise sestamibi stress test 06/24/2017: 1. The patient performed treadmill exercise using a Bruce  protocol, completing 7:34 minutes. The patient completed an estimated workload of 9.45 METS, reaching 89% of the maximum predicted heart rate. The patient did not develop symptoms other than fatigue during the procedure. Resting hypertension with normal blood pressure response at peak exercise. 2. The overall quality of the study is good. Left ventricular cavity is noted to be enlarged on the rest and stress studies. Review of the raw data in a rotational cine format reveals diaphragmatic attenuation. Gated SPECT images reveal normal myocardial thickening and wall motion. The left ventricular ejection fraction was calculated or visually estimated to be 61%. SPECT images demonstrate small perfusion abnormality of mild intensity in the mid inferolateral myocardial wall(s) on the stress images. Defects could be related to diaphragmatic attenuation.  Echocardiogram 06/24/2017: Left ventricle cavity is normal in size. Mild concentric hypertrophy of the left ventricle. Normal global wall motion. Doppler evidence of grade I (impaired) diastolic dysfunction, normal LAP. Calculated EF 70%. Left atrial cavity is mildly dilated. PFO reported on previous study on 06/02/2015, not well appreciated on this study. Consider agitated saline contrast study, if clinically indicated. Mild tricuspid regurgitation. Estimated pulmonary artery systolic pressure 36 mmHg. Compared to previous echocardiogram dated 06/02/2015,  pulmonary hypertension is new.  Assessment     ICD-10-CM   1. Essential hypertension  I10 CMP14+EGFR  2. Hypercholesterolemia  E78.00 Lipid Panel With LDL/HDL Ratio  3. Class 3 severe obesity due to excess calories without serious comorbidity with body mass index (BMI) of 40.0 to 44.9 in adult (HCC)  E66.01    Z68.41   4. Abnormal LFTs  R94.5 CMP14+EGFR  5. CKD (chronic kidney disease) stage 3, GFR 30-59 ml/min  N18.3    EKG 12/18/2018: Normal sinus rhythm with rate of 74 bpm, normal axis.  No evidence of ischemia, normal EKG.   Recommendations:   KENDARIOUS GUDINO  is a 48 y.o. African-American male with history of difficult to control hypertension, morbid obesity, hypercholesterolemia, obstructive sleep apnea on CPAP,  DANILE TRIER  is a 48 y.o. African-American male with history of difficult to control hypertension, morbid obesity, hypercholesterolemia, obstructive sleep apnea on CPAP, admitted to the hospital on 11/15/2018 with Covid 19 pneumonia, which is recuperated well.  He was seen by me 6 weeks ago as all his blood pressure medications were discontinued due to hypotension and he had developed uncontrolled hypertension.  This is a 6 week office visit, blood pressure is still not well-controlled.  However he has not been taking losartan regularly, he would like to start taking it on a daily basis.  He does have stage III chronic kidney disease hence needs CMP in 2 weeks as he also has abnormal LFTs and needs follow-up as he has restarted Crestor, will also repeat Lipid profile.  I have discussed with him regarding morbid obesity and he appears to be motivated in weight loss and would like to come back and see me in 4 weeks both for hypertension management and for compliance of his weight loss.  Adrian Prows, MD, [facility] 02/04/2019, 10:33 PM [facility]. [place] Pager: [phone] Office: [phone] If no answer Cell [phone]

## 2019-02-20 LAB — CMP14+EGFR
ALT: 29 IU/L (ref 0–44)
AST: 38 IU/L (ref 0–40)
Albumin/Globulin Ratio: 2 (ref 1.2–2.2)
Albumin: 5 g/dL (ref 4.0–5.0)
Alkaline Phosphatase: 53 IU/L (ref 39–117)
BUN/Creatinine Ratio: 12 (ref 9–20)
BUN: 15 mg/dL (ref 6–24)
Bilirubin Total: 0.5 mg/dL (ref 0.0–1.2)
CO2: 24 mmol/L (ref 20–29)
Calcium: 10 mg/dL (ref 8.7–10.2)
Chloride: 103 mmol/L (ref 96–106)
Creatinine, Ser: 1.3 mg/dL — ABNORMAL HIGH (ref 0.76–1.27)
GFR calc Af Amer: 75 mL/min/{1.73_m2} (ref >59)
GFR calc non Af Amer: 65 mL/min/{1.73_m2} (ref >59)
Globulin, Total: 2.5 g/dL (ref 1.5–4.5)
Glucose: 88 mg/dL (ref 65–99)
Potassium: 4.7 mmol/L (ref 3.5–5.2)
Sodium: 141 mmol/L (ref 134–144)
Total Protein: 7.5 g/dL (ref 6.0–8.5)

## 2019-02-20 LAB — LIPID PANEL WITH LDL/HDL RATIO
Cholesterol, Total: 175 mg/dL (ref 100–199)
HDL: 51 mg/dL (ref >39)
LDL Chol Calc (NIH): 109 mg/dL — ABNORMAL HIGH (ref 0–99)
LDL/HDL Ratio: 2.1 ratio (ref 0.0–3.6)
Triglycerides: 82 mg/dL (ref 0–149)
VLDL Cholesterol Cal: 15 mg/dL (ref 5–40)

## 2019-03-13 ENCOUNTER — Other Ambulatory Visit: Payer: Self-pay

## 2019-03-13 ENCOUNTER — Encounter: Payer: Self-pay | Admitting: Cardiology

## 2019-03-13 ENCOUNTER — Ambulatory Visit: Payer: Managed Care, Other (non HMO) | Admitting: Cardiology

## 2019-03-13 VITALS — BP 128/83 | HR 72 | Temp 97.4°F | Ht 73.0 in | Wt 354.9 lb

## 2019-03-13 DIAGNOSIS — I10 Essential (primary) hypertension: Secondary | ICD-10-CM | POA: Diagnosis not present

## 2019-03-13 DIAGNOSIS — Z6841 Body Mass Index (BMI) 40.0 and over, adult: Secondary | ICD-10-CM

## 2019-03-13 DIAGNOSIS — U071 COVID-19: Secondary | ICD-10-CM | POA: Diagnosis not present

## 2019-03-13 DIAGNOSIS — E7800 Pure hypercholesterolemia, unspecified: Secondary | ICD-10-CM

## 2019-03-13 NOTE — Progress Notes (Signed)
Primary Physician/Referring:  Iona Beard, MD  Patient ID: Martin [name], male    DOB: 01-Jun-1970, 48 y.o.   MRN: [id number]  Chief Complaint  Patient presents with   Hyperlipidemia   Hypertension   Follow-up    4 week   HPI:    HPI: Martin [name]  is a 48 y.o. African-American male with history of difficult to control hypertension, morbid obesity, hypercholesterolemia, obstructive sleep apnea on CPAP,  Martin [name]  is a 48 y.o. African-American male with history of difficult to control hypertension, morbid obesity, hypercholesterolemia, obstructive sleep apnea on CPAP, admitted to the hospital on 11/15/2018 with Covid 19 pneumonia, which is recuperated well.  He was seen by me 6 weeks ago as all his blood pressure medications were discontinued due to hypotension and he had developed uncontrolled hypertension.  He now presents for 6 week visit, we have restarted him back on his losartan and also Crestor was held and after checking the labs is now back on Crestor.  States that he is doing well.  No specific complaints today.  Past Medical History:  Diagnosis Date   Gout    Hyperlipidemia    Hypertension    Renal disorder    Past Surgical History:  Procedure Laterality Date   BACK SURGERY     Social History   Socioeconomic History   Marital status: Married    Spouse name: Jonelle Sidle   Number of children: 4   Years of education: 12   Highest education level: High school graduate  Occupational History   Not on file  Social Needs   Financial resource strain: Not hard at all   Food insecurity    Worry: Never true    Inability: Never true   Transportation needs    Medical: No    Non-medical: No  Tobacco Use   Smoking status: Never Smoker   Smokeless tobacco: Never Used  Substance and Sexual Activity   Alcohol use: Yes   Drug use: No   Sexual activity: Yes  Lifestyle   Physical activity    Days per week: 0 days    Minutes per session: 0 min    Stress: Not at all  Relationships   Social connections    Talks on phone: Twice a week    Gets together: Twice a week    Attends religious service: More than 4 times per year    Active member of club or organization: Yes    Attends meetings of clubs or organizations: 1 to 4 times per year    Relationship status: Married   Intimate partner violence    Fear of current or ex partner: No    Emotionally abused: No    Physically abused: No    Forced sexual activity: No  Other Topics Concern   Not on file  Social History Narrative   Not on file   ROS  Review of Systems  Constitution: Negative for chills, decreased appetite, malaise/fatigue and weight gain.  Cardiovascular: Negative for dyspnea on exertion, leg swelling and syncope.  Respiratory: Positive for snoring (OSA onn CPAP).   Endocrine: Negative for cold intolerance.  Hematologic/Lymphatic: Does not bruise/bleed easily.  Musculoskeletal: Positive for joint pain. Negative for joint swelling.  Gastrointestinal: Negative for abdominal pain, anorexia, change in bowel habit, hematochezia and melena.  Neurological: Negative for headaches and light-headedness.  Psychiatric/Behavioral: Negative for depression and substance abuse.  All other systems reviewed and are negative.  Objective    Vitals  with BMI 03/13/2019 02/04/2019 12/18/2018  Height 6' 1"  6' 4"  6' 4"   Weight 354 lbs 14 oz 362 lbs 3 oz 358 lbs  BMI 46.83 79.48 01.6  Systolic 553 748 270  Diastolic 83 88 89  Pulse 72 72 74    Physical Exam  Constitutional: He appears well-developed. No distress.  Morbidly obese  HENT:  Head: Atraumatic.  Eyes: Conjunctivae are normal.  Neck: Neck supple. No thyromegaly present.  Short neck and difficult to evaluate JVP  Cardiovascular: Normal rate, regular rhythm and normal heart sounds. Exam reveals no gallop.  No murmur heard. Pulses:      Carotid pulses are 2+ on the right side and 2+ on the left side.      Dorsalis pedis  pulses are 2+ on the right side and 2+ on the left side.       Posterior tibial pulses are 2+ on the right side and 2+ on the left side.  Femoral and popliteal pulse difficult to feel due to patient's body habitus.   Pulmonary/Chest: Effort normal and breath sounds normal.  Abdominal: Soft. Bowel sounds are normal.  Obese. Pannus present  Musculoskeletal: Normal range of motion.        General: No edema.  Neurological: He is alert.  Skin: Skin is warm and dry.  Psychiatric: He has a normal mood and affect.   Radiology: No results found.  Laboratory examination:   CMP Latest Ref Rng & Units 02/19/2019 11/18/2018 11/17/2018  Glucose 65 - 99 mg/dL 88 158(H) 203(H)  BUN 6 - 24 mg/dL 15 42(H) 40(H)  Creatinine 0.76 - 1.27 mg/dL 1.30(H) 1.38(H) 1.71(H)  Sodium 134 - 144 mmol/L 141 138 139  Potassium 3.5 - 5.2 mmol/L 4.7 3.9 4.0  Chloride 96 - 106 mmol/L 103 100 100  CO2 20 - 29 mmol/L 24 24 23   Calcium 8.7 - 10.2 mg/dL 10.0 8.6(L) 9.5  Total Protein 6.0 - 8.5 g/dL 7.5 - 8.7(H)  Total Bilirubin 0.0 - 1.2 mg/dL 0.5 - 0.3  Alkaline Phos 39 - 117 IU/L 53 - 57  AST 0 - 40 IU/L 38 - 85(H)  ALT 0 - 44 IU/L 29 - 112(H)   CBC Latest Ref Rng & Units 11/18/2018 11/17/2018 11/16/2018  WBC 4.0 - 10.5 K/uL 21.0(H) 20.8(H) 10.3  Hemoglobin 13.0 - 17.0 g/dL 12.6(L) 13.9 13.8  Hematocrit 39.0 - 52.0 % 40.3 44.0 42.2  Platelets 150 - 400 K/uL 313 324 259   Lipid Panel     Component Value Date/Time   CHOL 175 02/19/2019 1154   TRIG 82 02/19/2019 1154   HDL 51 02/19/2019 1154   LDLCALC 109 (H) 02/19/2019 1154   LABVLDL 15 02/19/2019 1154     Labs 06/06/2017: Serum glucose 99 mg, BUN 21, creatinine 1.4, eGFR date then 60 mL. Sodium 146, potassium 4.1. AST minimally elevated at 50, ALT normal. Otherwise CMP normal.  Labs 05/15/2017: Total cholesterol 278, triglycerides 101, HDL 56, LDL 200. Non-HDL cholesterol 122. BUN 25, creatinine 1.42, eGFR 69 mL, potassium 3.9. AST 74, ALT 45.  May 2016-  Cholesterol-264, LDL-182, HDL-50, triglycerides-159.     Component Value Date/Time   CHOL 175 02/19/2019 1154   TRIG 82 02/19/2019 1154   HDL 51 02/19/2019 1154   LDLCALC 109 (H) 02/19/2019 1154   HEMOGLOBIN A1C No results found for: HGBA1C, MPG TSH No results for input(s): TSH in the last 8760 hours. Medications   Current Outpatient Medications  Medication Instructions   acetaminophen (TYLENOL) 1,000  mg, Every 6 hours PRN   amLODipine (NORVASC) 10 mg, Daily   clotrimazole-betamethasone (LOTRISONE) cream 1 application, Topical, Daily PRN   hydrALAZINE (APRESOLINE) 50 MG tablet 1 tablet, Oral, 3 times daily PRN, For SBP > 140 mm Hg   losartan (COZAAR) 100 mg, Oral, Every evening   rosuvastatin (CRESTOR) 10 mg, Oral, Daily   spironolactone (ALDACTONE) 25 mg, Oral, Daily   Cardiac Studies:   Exercise sestamibi stress test 06/24/2017: 1. The patient performed treadmill exercise using a Bruce protocol, completing 7:34 minutes. The patient completed an estimated workload of 9.45 METS, reaching 89% of the maximum predicted heart rate. The patient did not develop symptoms other than fatigue during the procedure. Resting hypertension with normal blood pressure response at peak exercise. 2. The overall quality of the study is good. Left ventricular cavity is noted to be enlarged on the rest and stress studies. Review of the raw data in a rotational cine format reveals diaphragmatic attenuation. Gated SPECT images reveal normal myocardial thickening and wall motion. The left ventricular ejection fraction was calculated or visually estimated to be 61%. SPECT images demonstrate small perfusion abnormality of mild intensity in the mid inferolateral myocardial wall(s) on the stress images. Defects could be related to diaphragmatic attenuation.  Echocardiogram 06/24/2017: Left ventricle cavity is normal in size. Mild concentric hypertrophy of the left ventricle. Normal global wall motion.  Doppler evidence of grade I (impaired) diastolic dysfunction, normal LAP. Calculated EF 70%. Left atrial cavity is mildly dilated. PFO reported on previous study on 06/02/2015, not well appreciated on this study. Consider agitated saline contrast study, if clinically indicated. Mild tricuspid regurgitation. Estimated pulmonary artery systolic pressure 36 mmHg. Compared to previous echocardiogram dated 06/02/2015, pulmonary hypertension is new.  Assessment     ICD-10-CM   1. Essential hypertension  I10   2. Hypercholesterolemia  E78.00   3. Class 3 severe obesity due to excess calories without serious comorbidity with body mass index (BMI) of 40.0 to 44.9 in adult [facility])  E66.01    Z68.41    EKG 12/18/2018: Normal sinus rhythm with rate of 74 bpm, normal axis.  No evidence of ischemia, normal EKG.   Recommendations:   JAASIEL HOLLYFIELD  is an African-American male with history of difficult to control hypertension, morbid obesity, hypercholesterolemia, obstructive sleep apnea on CPAP, admitted to the hospital on 11/15/2018 with Covid 19 pneumonia, which is recuperated well.  He was seen by me 6 weeks ago as all his blood pressure medications were discontinued due to hypotension and he had developed uncontrolled hypertension.  This is a 6 week office visit, blood pressure is well-controlled. He has lost 8 Lbs. Labs reviewed, LVT back to normal and LDL elevated as crestor was on hold. He is back on this now.  As his labs are normalized and his back on his usual medications and blood pressure is well controlled, I'll see him back on a p.r.n. basis.  Patient appears to be very motivated in weight loss.   Adrian Prows, MD, [facility] 03/13/2019, 12:04 PM [facility]. [place] Pager: [phone] Office: [phone] If no answer Cell [phone]

## 2019-03-14 ENCOUNTER — Encounter (INDEPENDENT_AMBULATORY_CARE_PROVIDER_SITE_OTHER): Payer: Self-pay

## 2019-05-21 ENCOUNTER — Other Ambulatory Visit: Payer: Self-pay | Admitting: Cardiology

## 2019-11-18 ENCOUNTER — Other Ambulatory Visit: Payer: Self-pay | Admitting: Cardiology

## 2020-01-20 ENCOUNTER — Other Ambulatory Visit: Payer: Self-pay | Admitting: Cardiology

## 2020-04-05 ENCOUNTER — Other Ambulatory Visit: Payer: Self-pay | Admitting: Family Medicine

## 2020-04-05 ENCOUNTER — Ambulatory Visit
Admission: RE | Admit: 2020-04-05 | Discharge: 2020-04-05 | Disposition: A | Discharge status: Home / Self Care | Payer: Managed Care, Other (non HMO) | Source: Ambulatory Visit | Attending: Family Medicine | Admitting: Family Medicine

## 2020-04-05 ENCOUNTER — Other Ambulatory Visit: Payer: Self-pay

## 2020-04-05 DIAGNOSIS — M4722 Other spondylosis with radiculopathy, cervical region: Secondary | ICD-10-CM

## 2020-07-16 ENCOUNTER — Other Ambulatory Visit: Payer: Self-pay | Admitting: Cardiology

## 2021-01-19 ENCOUNTER — Other Ambulatory Visit: Payer: Self-pay

## 2021-01-19 ENCOUNTER — Encounter (HOSPITAL_BASED_OUTPATIENT_CLINIC_OR_DEPARTMENT_OTHER): Payer: Self-pay | Admitting: *Deleted

## 2021-01-19 ENCOUNTER — Emergency Department (HOSPITAL_BASED_OUTPATIENT_CLINIC_OR_DEPARTMENT_OTHER)
Admission: EM | Admit: 2021-01-19 | Discharge: 2021-01-19 | Disposition: A | Discharge status: Home / Self Care | Payer: 59 | Attending: Emergency Medicine | Admitting: Emergency Medicine

## 2021-01-19 ENCOUNTER — Emergency Department (HOSPITAL_BASED_OUTPATIENT_CLINIC_OR_DEPARTMENT_OTHER): Payer: 59

## 2021-01-19 DIAGNOSIS — Z8616 Personal history of COVID-19: Secondary | ICD-10-CM | POA: Diagnosis not present

## 2021-01-19 DIAGNOSIS — I10 Essential (primary) hypertension: Secondary | ICD-10-CM | POA: Insufficient documentation

## 2021-01-19 DIAGNOSIS — R109 Unspecified abdominal pain: Secondary | ICD-10-CM | POA: Insufficient documentation

## 2021-01-19 DIAGNOSIS — Z79899 Other long term (current) drug therapy: Secondary | ICD-10-CM | POA: Insufficient documentation

## 2021-01-19 LAB — CBC
HCT: 44.5 % (ref 39.0–52.0)
Hemoglobin: 14.3 g/dL (ref 13.0–17.0)
MCH: 26 pg (ref 26.0–34.0)
MCHC: 32.1 g/dL (ref 30.0–36.0)
MCV: 81.1 fL (ref 80.0–100.0)
Platelets: 204 10*3/uL (ref 150–400)
RBC: 5.49 MIL/uL (ref 4.22–5.81)
RDW: 16.2 % — ABNORMAL HIGH (ref 11.5–15.5)
WBC: 4.8 10*3/uL (ref 4.0–10.5)
nRBC: 0 % (ref 0.0–0.2)

## 2021-01-19 LAB — HEPATIC FUNCTION PANEL
ALT: 32 U/L (ref 0–44)
AST: 34 U/L (ref 15–41)
Albumin: 4.7 g/dL (ref 3.5–5.0)
Alkaline Phosphatase: 44 U/L (ref 38–126)
Bilirubin, Direct: 0.1 mg/dL (ref 0.0–0.2)
Indirect Bilirubin: 0.6 mg/dL (ref 0.3–0.9)
Total Bilirubin: 0.7 mg/dL (ref 0.3–1.2)
Total Protein: 7.4 g/dL (ref 6.5–8.1)

## 2021-01-19 LAB — URINALYSIS, ROUTINE W REFLEX MICROSCOPIC
Bilirubin Urine: NEGATIVE
Glucose, UA: NEGATIVE mg/dL
Hgb urine dipstick: NEGATIVE
Ketones, ur: NEGATIVE mg/dL
Leukocytes,Ua: NEGATIVE
Nitrite: NEGATIVE
Specific Gravity, Urine: 1.024 (ref 1.005–1.030)
pH: 5.5 (ref 5.0–8.0)

## 2021-01-19 LAB — BASIC METABOLIC PANEL
Anion gap: 10 (ref 5–15)
BUN: 15 mg/dL (ref 6–20)
CO2: 26 mmol/L (ref 22–32)
Calcium: 10.1 mg/dL (ref 8.9–10.3)
Chloride: 103 mmol/L (ref 98–111)
Creatinine, Ser: 1.32 mg/dL — ABNORMAL HIGH (ref 0.61–1.24)
GFR, Estimated: >60 mL/min (ref >60)
Glucose, Bld: 131 mg/dL — ABNORMAL HIGH (ref 70–99)
Potassium: 4 mmol/L (ref 3.5–5.1)
Sodium: 139 mmol/L (ref 135–145)

## 2021-01-19 LAB — LIPASE, BLOOD: Lipase: 24 U/L (ref 11–51)

## 2021-01-19 MED ORDER — CYCLOBENZAPRINE HCL 10 MG PO TABS
10.0000 mg | ORAL_TABLET | Freq: Two times a day (BID) | ORAL | 0 refills | Status: AC | PRN
Start: 2021-01-19 — End: ?

## 2021-01-19 MED ORDER — KETOROLAC TROMETHAMINE 30 MG/ML IJ SOLN
30.0000 mg | Freq: Once | INTRAMUSCULAR | Status: AC
  Administered 2021-01-19: 30 mg via INTRAVENOUS
  Filled 2021-01-19: qty 1

## 2021-01-19 NOTE — ED Triage Notes (Signed)
Woke up  yesterday with left flank pain.  Denies nausea and vomiting.

## 2021-01-19 NOTE — Discharge Instructions (Addendum)
Overall I suspect your pain is from a muscular source.  Recommend 400 mg ibuprofen every 8 hours as needed for pain.  Recommend 1000 mg of Tylenol every 6 hours as needed for pain.  Take Flexeril as a muscle relaxant.  This medicine can be sedating so please do not use if doing any dangerous activities including driving.  Suspect that this will improve with time.  Follow-up with primary care doctor.

## 2021-01-19 NOTE — ED Provider Notes (Signed)
[facility] EMERGENCY DEPT Provider Note   CSN: [id number] Arrival date & time: 01/19/21  1126     History Chief Complaint  Patient presents with   Flank Pain    Martin [name] is a 50 y.o. male.  The history is provided by the patient.  Flank Pain This is a new problem. The problem occurs daily. The problem has not changed since onset.Pertinent negatives include no chest pain, no abdominal pain, no headaches and no shortness of breath. The symptoms are aggravated by twisting. The symptoms are relieved by NSAIDs. The treatment provided mild relief.      Past Medical History:  Diagnosis Date   Gout    Hyperlipidemia    Hypertension    Renal disorder     Patient Active Problem List   Diagnosis Date Noted   COVID-19 virus infection 11/15/2018   GERD 01/12/2007   Hypercholesterolemia 12/30/2006   GOUT NOS 12/30/2006   Essential hypertension 12/30/2006   BACK PAIN, LUMBAR 10/08/2006   FATTY LIVER DISEASE, HX OF 02/22/2006    Past Surgical History:  Procedure Laterality Date   BACK SURGERY         Family History  Problem Relation Age of Onset   Hypertension Mother    Diabetes Mother    Gout Mother    Hypertension Father    Heart failure Father    Cancer Maternal Grandmother    Cancer Maternal Grandfather     Social History   Tobacco Use   Smoking status: Never   Smokeless tobacco: Never  Vaping Use   Vaping Use: Never used  Substance Use Topics   Alcohol use: Yes    Comment: occasionally   Drug use: No    Home Medications Prior to Admission medications   Medication Sig Start Date End Date Taking? Authorizing Provider  acetaminophen (TYLENOL) 500 MG tablet Take 1,000 mg by mouth every 6 (six) hours as needed for pain.   Yes [provider]  amLODipine (NORVASC) 10 MG tablet Take 10 mg by mouth daily.     Yes [provider]  clotrimazole-betamethasone (LOTRISONE) cream Apply 1 application topically daily as needed.  10/22/18  Yes [provider]  cyclobenzaprine (FLEXERIL) 10 MG tablet Take 1 tablet (10 mg total) by mouth 2 (two) times daily as needed for muscle spasms. 01/19/21  Yes Wiatt Mahabir, DO  ibuprofen (ADVIL) 200 MG tablet Take 200 mg by mouth every 6 (six) hours as needed.   Yes [provider]  losartan (COZAAR) 100 MG tablet Take 100 mg by mouth every evening.   Yes [provider]  Menthol, Topical Analgesic, (BIOFREEZE) 10 % CREA Apply topically.   Yes [provider]  rosuvastatin (CRESTOR) 10 MG tablet Take 1 tablet (10 mg total) by mouth daily. 12/25/18  Yes Yates Decamp, MD  spironolactone (ALDACTONE) 25 MG tablet TAKE 1 TABLET BY MOUTH EVERY DAY 01/20/20  Yes Yates Decamp, MD    Allergies    Benazepril hcl  Review of Systems   Review of Systems  Constitutional:  Negative for chills and fever.  HENT:  Negative for ear pain and sore throat.   Eyes:  Negative for pain and visual disturbance.  Respiratory:  Negative for cough and shortness of breath.   Cardiovascular:  Negative for chest pain and palpitations.  Gastrointestinal:  Negative for abdominal pain and vomiting.  Genitourinary:  Positive for flank pain. Negative for dysuria, hematuria and testicular pain.  Musculoskeletal:  Negative for arthralgias  and back pain.  Skin:  Negative for color change and rash.  Neurological:  Negative for seizures, syncope and headaches.  All other systems reviewed and are negative.  Physical Exam Updated Vital Signs BP (!) 146/88   Pulse 62   Temp 98.2 F (36.8 C)   Resp 12   Ht 6\' 4"  (1.93 m)   Wt (!) 167.8 kg   SpO2 100%   BMI 45.04 kg/m   Physical Exam Vitals and nursing note reviewed.  Constitutional:      Appearance: He is well-developed.  HENT:     Head: Normocephalic and atraumatic.     Nose: Nose normal.     Mouth/Throat:     Mouth: Mucous membranes are moist.  Eyes:     Extraocular Movements: Extraocular movements intact.      Conjunctiva/sclera: Conjunctivae normal.     Pupils: Pupils are equal, round, and reactive to light.  Cardiovascular:     Rate and Rhythm: Normal rate and regular rhythm.     Pulses: Normal pulses.     Heart sounds: Normal heart sounds. No murmur heard. Pulmonary:     Effort: Pulmonary effort is normal. No respiratory distress.     Breath sounds: Normal breath sounds.  Abdominal:     General: Abdomen is flat.     Palpations: Abdomen is soft.     Tenderness: There is no abdominal tenderness.  Musculoskeletal:        General: Tenderness present.     Cervical back: Neck supple.     Comments: Tenderness to left CVA but no midline spinal tenderness  Skin:    General: Skin is warm and dry.     Capillary Refill: Capillary refill takes less than 2 seconds.  Neurological:     General: No focal deficit present.     Mental Status: He is alert.  Psychiatric:        Mood and Affect: Mood normal.    ED Results / Procedures / Treatments   Labs (all labs ordered are listed, but only abnormal results are displayed) Labs Reviewed  BASIC METABOLIC PANEL - Abnormal; Notable for the following components:      Result Value   Glucose, Bld 131 (*)    Creatinine, Ser 1.32 (*)    All other components within normal limits  CBC - Abnormal; Notable for the following components:   RDW 16.2 (*)    All other components within normal limits  URINALYSIS, ROUTINE W REFLEX MICROSCOPIC - Abnormal; Notable for the following components:   Protein, ur TRACE (*)    All other components within normal limits  LIPASE, BLOOD  HEPATIC FUNCTION PANEL    EKG EKG Interpretation  Date/Time:  Thursday January 19 2021 16:35:43 EDT Ventricular Rate:  57 PR Interval:  189 QRS Duration: 119 QT Interval:  435 QTC Calculation: 424 R Axis:   103 Text Interpretation: Sinus rhythm Nonspecific intraventricular conduction delay Confirmed by 02-05-1995, Jet Traynham ([id number]) on 01/19/2021 4:53:24 PM  Radiology No results  found.  Procedures Procedures   Medications Ordered in ED Medications  ketorolac (TORADOL) 30 MG/ML injection 30 mg (has no administration in time range)    ED Course  I have reviewed the triage vital signs and the nursing notes.  Pertinent labs & imaging results that were available during my care of the patient were reviewed by me and considered in my medical decision making (see chart for details).    MDM Rules/Calculators/A&P  HAKOP HUMBARGER is here for left flank pain.  History of hypertension, high cholesterol.  Normal vitals.  Lab work unremarkable.  Does not appear to be pancreatitis or cholecystitis.  Urinalysis negative for infection.  Tenderness in the left flank concerning for may be kidney stone may be diverticulitis.  Could be musculoskeletal process.  CT scan was obtained.  Due to technological issues radiology was able to give me preliminary read of the CT report that showed no evidence of kidney stone or other acute intra-abdominal findings.  Overall suspect this is a musculoskeletal problem.  Given Toradol.  Prescribed Flexeril.  Discharged in good condition.  Recommend follow-up with primary care doctor.  This chart was dictated using voice recognition software.  Despite best efforts to proofread,  errors can occur which can change the documentation meaning.   Final Clinical Impression(s) / ED Diagnoses Final diagnoses:  Flank pain    Rx / DC Orders ED Discharge Orders          Ordered    cyclobenzaprine (FLEXERIL) 10 MG tablet  2 times daily PRN        01/19/21 1921             Virgina Norfolk, DO 01/19/21 1923

## 2022-01-08 ENCOUNTER — Ambulatory Visit (HOSPITAL_COMMUNITY)
Admission: EM | Admit: 2022-01-08 | Discharge: 2022-01-08 | Disposition: A | Discharge status: Home / Self Care | Payer: PRIVATE HEALTH INSURANCE

## 2022-01-08 ENCOUNTER — Encounter (HOSPITAL_COMMUNITY): Payer: Self-pay

## 2022-01-08 DIAGNOSIS — L02212 Cutaneous abscess of back [any part, except buttock]: Secondary | ICD-10-CM | POA: Diagnosis not present

## 2022-01-08 MED ORDER — SULFAMETHOXAZOLE-TRIMETHOPRIM 800-160 MG PO TABS: 1.0000 | ORAL_TABLET | Freq: Two times a day (BID) | ORAL | 0 refills | Status: AC

## 2022-01-08 NOTE — Discharge Instructions (Addendum)
Continue warm compress daily, area will continue to drain.  Take antibiotic as prescribed Follow up with dermatology

## 2022-01-08 NOTE — ED Provider Notes (Addendum)
[facility]    CSN: [id number] Arrival date & time: 01/08/22  1407      History   Chief Complaint Chief Complaint  Patient presents with   Abscess    HPI Martin [name] is a 51 y.o. male.   Pt presents with a nodule to his upper back that has been there for several months, but became painful with drainage over the last few days.  He has applied a warm compress and kept the area covered.  Denies fever, chills.      Past Medical History:  Diagnosis Date   Gout    Hyperlipidemia    Hypertension    Renal disorder     Patient Active Problem List   Diagnosis Date Noted   COVID-19 virus infection 11/15/2018   GERD 01/12/2007   Hypercholesterolemia 12/30/2006   GOUT NOS 12/30/2006   Essential hypertension 12/30/2006   BACK PAIN, LUMBAR 10/08/2006   FATTY LIVER DISEASE, HX OF 02/22/2006    Past Surgical History:  Procedure Laterality Date   BACK SURGERY         Home Medications    Prior to Admission medications   Medication Sig Start Date End Date Taking? Authorizing Provider  sulfamethoxazole-trimethoprim (BACTRIM DS) 800-160 MG tablet Take 1 tablet by mouth 2 (two) times daily for 5 days. 01/08/22 01/13/22 Yes Ward, Tylene Fantasia, PA-C  acetaminophen (TYLENOL) 500 MG tablet Take 1,000 mg by mouth every 6 (six) hours as needed for pain.    [provider]  amLODipine (NORVASC) 10 MG tablet Take 10 mg by mouth daily.      [provider]  clotrimazole-betamethasone (LOTRISONE) cream Apply 1 application topically daily as needed. 10/22/18   [provider]  cyclobenzaprine (FLEXERIL) 10 MG tablet Take 1 tablet (10 mg total) by mouth 2 (two) times daily as needed for muscle spasms. 01/19/21   Curatolo, Adam, DO  ibuprofen (ADVIL) 200 MG tablet Take 200 mg by mouth every 6 (six) hours as needed.    [provider]  losartan (COZAAR) 100 MG tablet Take 100 mg by mouth every evening.    [provider]  Menthol, Topical  Analgesic, (BIOFREEZE) 10 % CREA Apply topically.    [provider]  pantoprazole (PROTONIX) 40 MG tablet Take 40 mg by mouth daily. 09/08/21   [provider]  rosuvastatin (CRESTOR) 10 MG tablet Take 1 tablet (10 mg total) by mouth daily. 12/25/18   Yates Decamp, MD  spironolactone (ALDACTONE) 25 MG tablet TAKE 1 TABLET BY MOUTH EVERY DAY 01/20/20   Yates Decamp, MD    Family History Family History  Problem Relation Age of Onset   Hypertension Mother    Diabetes Mother    Gout Mother    Hypertension Father    Heart failure Father    Cancer Maternal Grandmother    Cancer Maternal Grandfather     Social History Social History   Tobacco Use   Smoking status: Never   Smokeless tobacco: Never  Vaping Use   Vaping Use: Never used  Substance Use Topics   Alcohol use: Yes    Comment: occasionally   Drug use: No     Allergies   Benazepril hcl   Review of Systems Review of Systems  Constitutional:  Negative for chills and fever.  HENT:  Negative for ear pain and sore throat.   Eyes:  Negative for pain and visual disturbance.  Respiratory:  Negative for cough and shortness of breath.  Cardiovascular:  Negative for chest pain and palpitations.  Gastrointestinal:  Negative for abdominal pain and vomiting.  Genitourinary:  Negative for dysuria and hematuria.  Musculoskeletal:  Negative for arthralgias and back pain.  Skin:  Positive for wound. Negative for color change and rash.  Neurological:  Negative for seizures and syncope.  All other systems reviewed and are negative.    Physical Exam Triage Vital Signs ED Triage Vitals  Enc Vitals Group     BP 01/08/22 1530 131/80     Pulse Rate 01/08/22 1530 82     Resp 01/08/22 1530 12     Temp 01/08/22 1530 98.2 F (36.8 C)     Temp Source 01/08/22 1530 Oral     SpO2 01/08/22 1530 99 %     Weight 01/08/22 1528 (!) 360 lb (163.3 kg)     Height 01/08/22 1528 6\' 4"  (1.93 m)     Head Circumference --      Peak  Flow --      Pain Score 01/08/22 1528 0     Pain Loc --      Pain Edu? --      Excl. in GC? --    No data found.  Updated Vital Signs BP 131/80 (BP Location: Left Arm)   Pulse 82   Temp 98.2 F (36.8 C) (Oral)   Resp 12   Ht 6\' 4"  (1.93 m)   Wt (!) 360 lb (163.3 kg)   SpO2 99%   BMI 43.82 kg/m   Visual Acuity Right Eye Distance:   Left Eye Distance:   Bilateral Distance:    Right Eye Near:   Left Eye Near:    Bilateral Near:     Physical Exam Vitals and nursing note reviewed.  Constitutional:      General: He is not in acute distress.    Appearance: He is well-developed.  HENT:     Head: Normocephalic and atraumatic.  Eyes:     Conjunctiva/sclera: Conjunctivae normal.  Cardiovascular:     Rate and Rhythm: Normal rate and regular rhythm.     Heart sounds: No murmur heard. Pulmonary:     Effort: Pulmonary effort is normal. No respiratory distress.     Breath sounds: Normal breath sounds.  Abdominal:     Palpations: Abdomen is soft.     Tenderness: There is no abdominal tenderness.  Musculoskeletal:        General: No swelling.     Cervical back: Neck supple.  Skin:    General: Skin is warm and dry.     Capillary Refill: Capillary refill takes less than 2 seconds.       Neurological:     Mental Status: He is alert.  Psychiatric:        Mood and Affect: Mood normal.      UC Treatments / Results  Labs (all labs ordered are listed, but only abnormal results are displayed) Labs Reviewed - No data to display  EKG   Radiology No results found.  Procedures Procedures (including critical care time)  Medications Ordered in UC Medications - No data to display  Initial Impression / Assessment and Plan / UC Course  I have reviewed the triage vital signs and the nursing notes.  Pertinent labs & imaging results that were available during my care of the patient were reviewed by me and considered in my medical decision making (see chart for  details).     Abscess of what appears to be  a skin tag, actively draining, no I&D indicated at this time.  Advised continued warm compress.  Antibiotic prescribed.  Advised follow up with dermatology for excision.  Final Clinical Impressions(s) / UC Diagnoses   Final diagnoses:  Abscess of upper back excluding scapular region     Discharge Instructions      Continue warm compress daily, area will continue to drain.  Take antibiotic as prescribed Follow up with dermatology     ED Prescriptions     Medication Sig Dispense Auth. Provider   sulfamethoxazole-trimethoprim (BACTRIM DS) 800-160 MG tablet Take 1 tablet by mouth 2 (two) times daily for 5 days. 10 tablet Ward, Tylene Fantasia, PA-C      PDMP not reviewed this encounter.   Ward, Tylene Fantasia, PA-C 01/08/22 1546    Ward, Tylene Fantasia, PA-C 01/08/22 1546

## 2022-01-08 NOTE — ED Triage Notes (Signed)
Pt is here for a abscess on back of left shoulder x awhile as per pt. Pt denies nausea and fever

## 2022-05-23 ENCOUNTER — Other Ambulatory Visit: Payer: Self-pay | Admitting: Family Medicine

## 2022-05-23 DIAGNOSIS — R1013 Epigastric pain: Secondary | ICD-10-CM

## 2022-06-06 ENCOUNTER — Ambulatory Visit
Admission: RE | Admit: 2022-06-06 | Discharge: 2022-06-06 | Disposition: A | Discharge status: Home / Self Care | Payer: PRIVATE HEALTH INSURANCE | Source: Ambulatory Visit | Attending: Family Medicine | Admitting: Family Medicine

## 2022-06-06 DIAGNOSIS — R1013 Epigastric pain: Secondary | ICD-10-CM

## 2022-11-16 ENCOUNTER — Other Ambulatory Visit: Payer: Self-pay | Admitting: Neurosurgery

## 2022-11-16 DIAGNOSIS — M5412 Radiculopathy, cervical region: Secondary | ICD-10-CM

## 2022-11-16 DIAGNOSIS — M5416 Radiculopathy, lumbar region: Secondary | ICD-10-CM

## 2022-11-26 IMAGING — MR MR CERVICAL SPINE W/O CM
4 of 5 series · 27 of 48 positions shown · non-contrast
Comparison: None.

CLINICAL DATA: Neck pain radiating into right arm.

EXAM:
MRI CERVICAL SPINE WITHOUT CONTRAST
TECHNIQUE: Multiplanar, multisequence MR imaging of the cervical spine was
performed. No intravenous contrast was administered.

[Series 5: T2 · sagittal · 3.0mm · 0.55mm/px · 6 of 17 slices shown (1 of 2)]
[im 1/17]
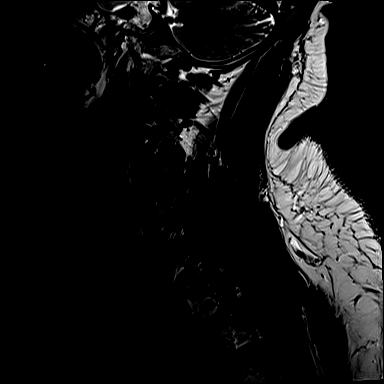
[im 4/17]
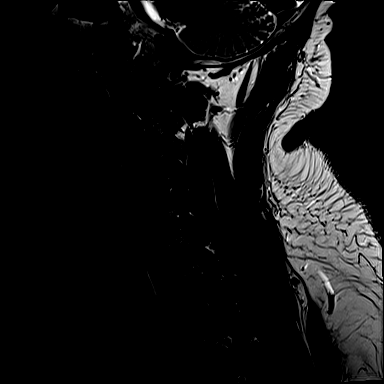
[im 7/17]
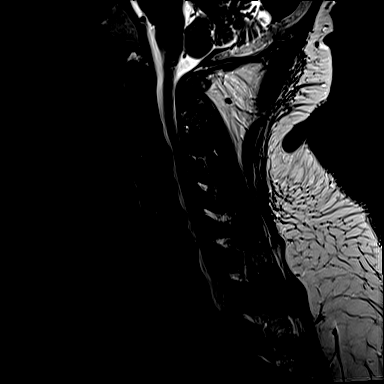
[im 10/17]
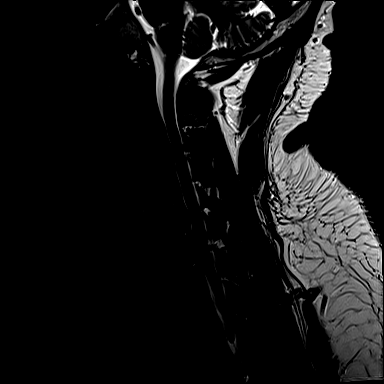
[im 13/17]
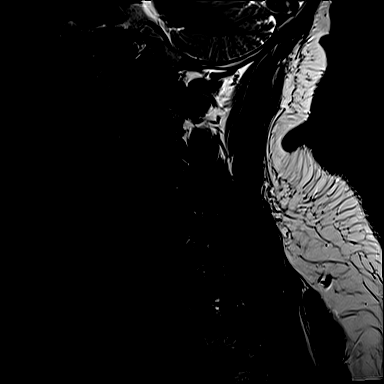
[im 17/17]
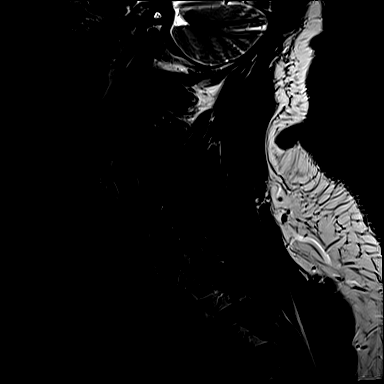

[Series 6: T1 · sagittal · 3.0mm · 0.66mm/px · 7 of 17 slices shown]
[im 1/17]
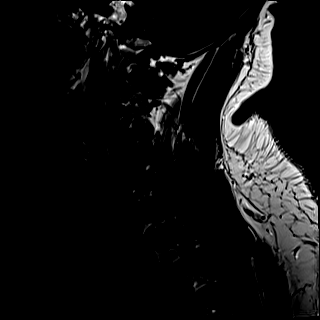
[im 3/17]
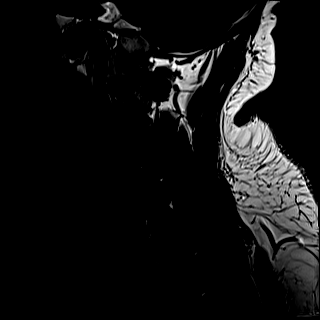
[im 6/17]
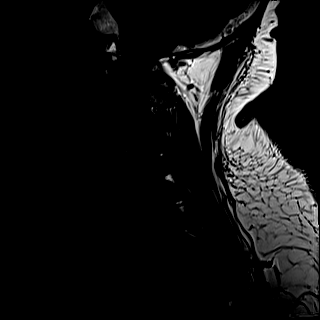
[im 9/17]
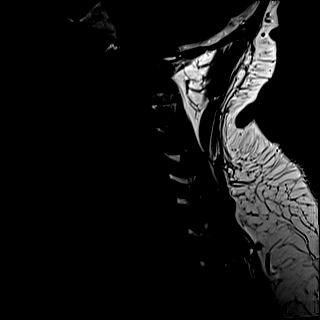
[im 11/17]
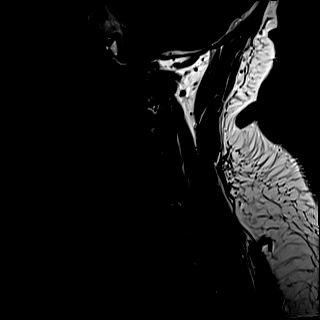
[im 14/17]
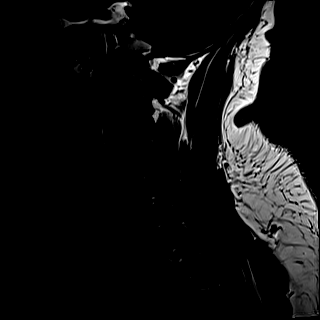
[im 17/17]
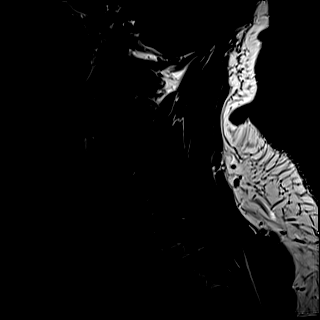

[Series 7: STIR · sagittal · 3.0mm · 0.33mm/px · 6 of 17 slices shown]
[im 1/17]
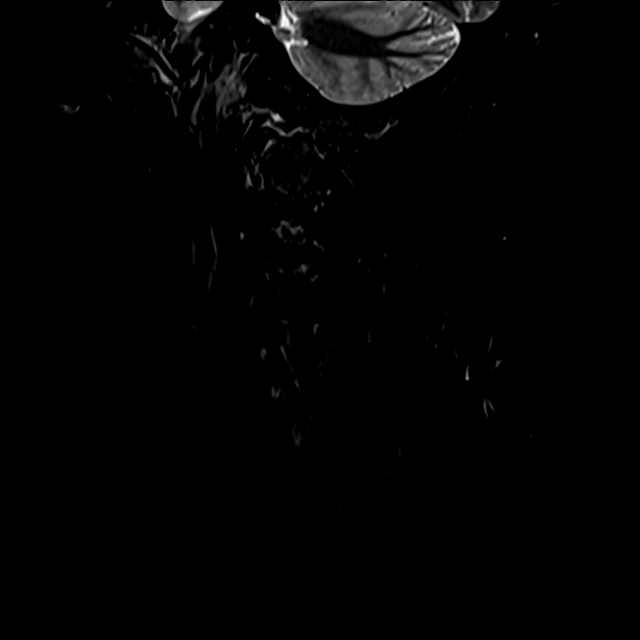
[im 3/17]
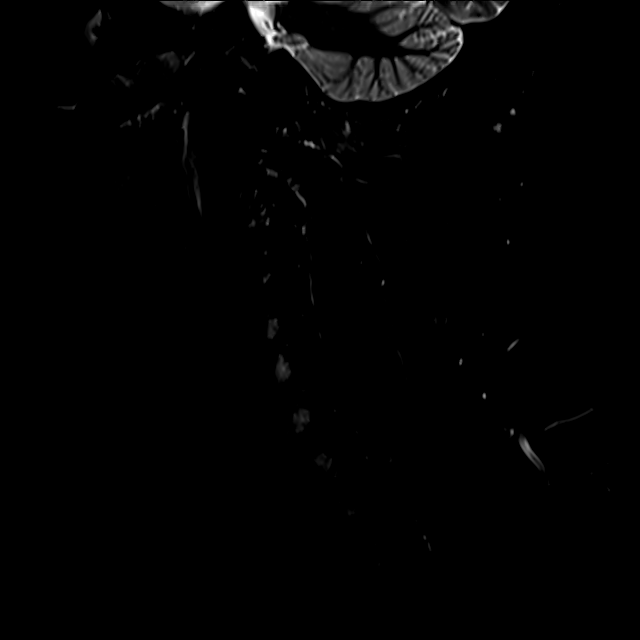
[im 6/17]
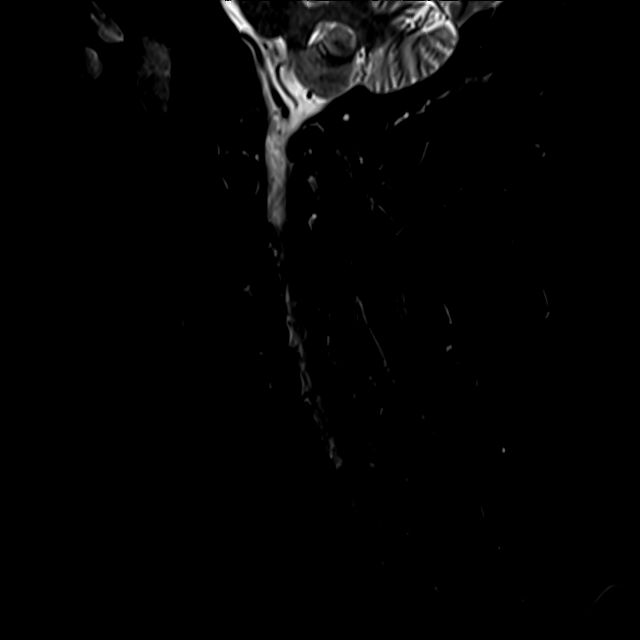
[im 9/17]
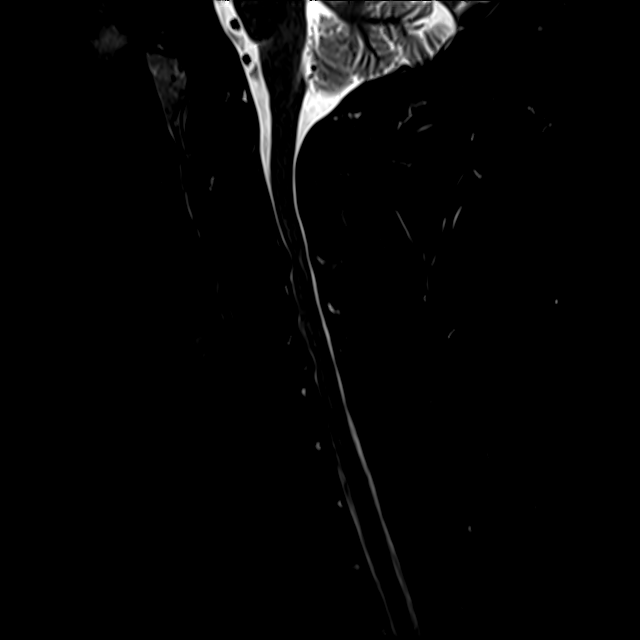
[im 11/17]
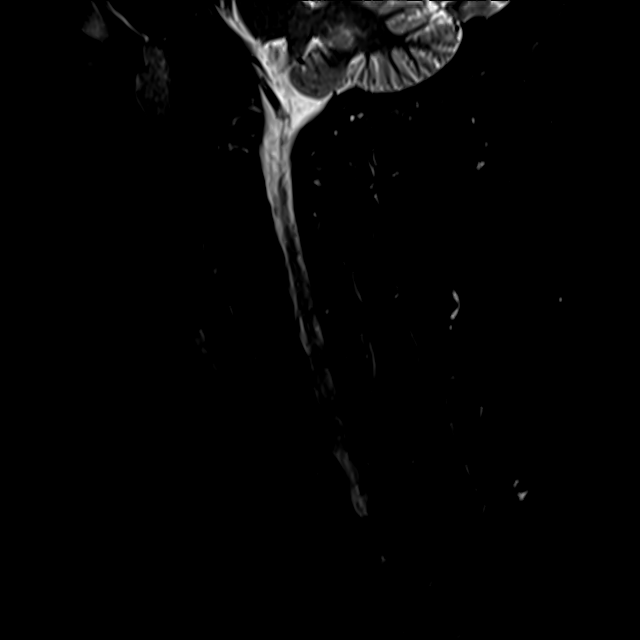
[im 14/17]
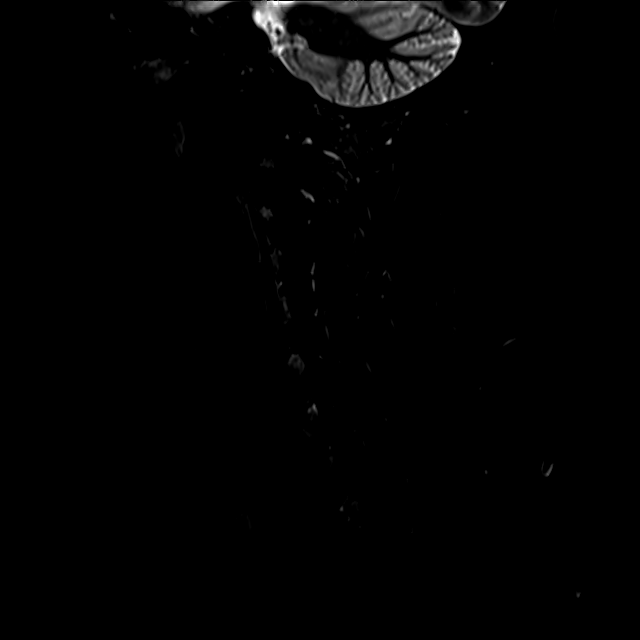

[Series 8: T2 · axial · 3.0mm · 0.50mm/px · z∈[-91,+12]mm · 8 of 33 slices shown (2 of 2)]
[im 1/33]
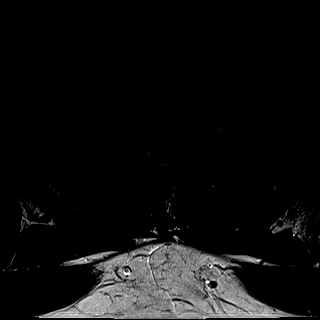
[im 5/33]
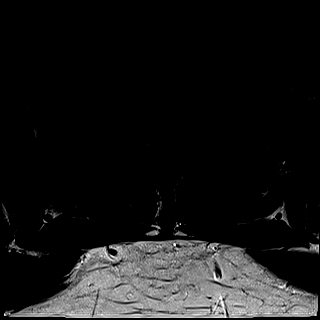
[im 10/33]
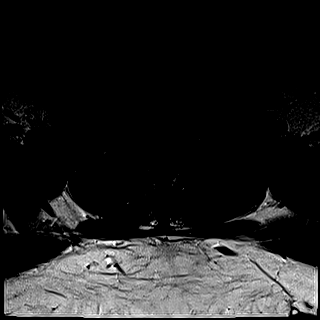
[im 15/33]
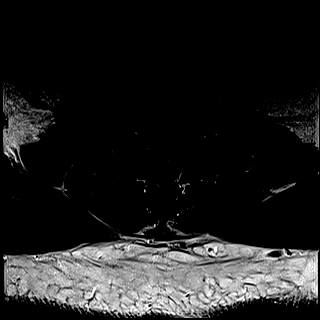
[im 18/33]
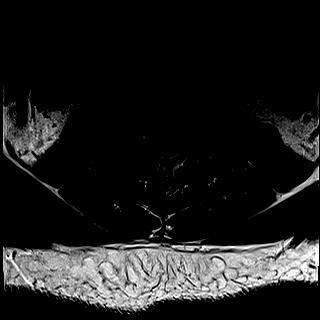
[im 23/33]
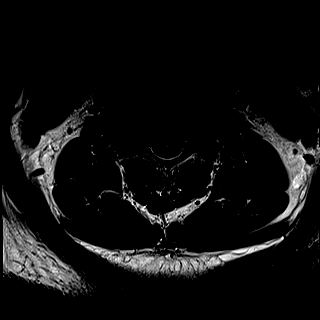
[im 28/33]
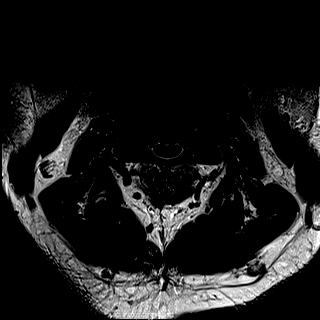
[im 33/33]
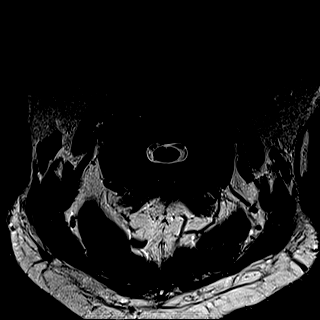

[27 of 48 positions shown; findings below may reference images not displayed]

FINDINGS: Alignment: Straightening of lordosis.

Vertebrae: Normal bone marrow signal intensity. No focal osseous
lesion.

Cord: Minimal STIR hyperintense signal at the C3-4 level ([DATE]).

Posterior Fossa, vertebral arteries: Negative.

Disc levels: Multilevel desiccation.

C2-3: Small disc osteophyte complex with uncovertebral and facet
degenerative spurring. Patent spinal canal and neural foramen.

C3-4: Disc osteophyte complex with superimposed right paracentral
protrusion abutting the ventral cord. Bilateral facet degenerative
spurring. Mild spinal canal narrowing. Patent neural foramen.

C4-5: Shallow central protrusion with uncovertebral and facet
degenerative spurring. Partial effacement of the ventral CSF
containing spaces. Patent spinal canal and neural foramen.

C5-6: Disc osteophyte complex with superimposed central protrusion
abutting the ventral cord. Uncovertebral and facet degenerative
spurring. Mild spinal canal narrowing. Patent neural foramen.

C6-7: Disc osteophyte complex with shallow central protrusion.
Superimposed right foraminal protrusion. Uncovertebral and facet
degenerative spurring. Patent spinal canal and left neural foramen.
Mild right neural foraminal narrowing.

C7-T1: Uncovertebral degenerative spurring with shallow left
foraminal protrusion. Patent spinal canal and right neural foramen.
Mild left neural foraminal narrowing.

Paraspinal tissues: Negative.
IMPRESSION: Multilevel spondylosis. Mild spinal canal narrowing at the C3-4,
C5-6 levels.

Mild STIR hyperintense cord signal at the C3-4 level may reflect
early myelomalacia versus artifact.

Mild right C6-7 and left C7-T1 neural foraminal narrowing.

## 2022-12-02 ENCOUNTER — Ambulatory Visit
Admission: RE | Admit: 2022-12-02 | Discharge: 2022-12-02 | Disposition: A | Discharge status: Home / Self Care | Payer: PRIVATE HEALTH INSURANCE | Source: Ambulatory Visit | Attending: Neurosurgery | Admitting: Neurosurgery

## 2022-12-02 ENCOUNTER — Ambulatory Visit
Admission: RE | Admit: 2022-12-02 | Discharge: 2022-12-02 | Disposition: A | Discharge status: Home / Self Care | Payer: Federal, State, Local not specified - PPO | Source: Ambulatory Visit | Attending: Neurosurgery | Admitting: Neurosurgery

## 2022-12-02 DIAGNOSIS — M5412 Radiculopathy, cervical region: Secondary | ICD-10-CM

## 2022-12-02 DIAGNOSIS — M5416 Radiculopathy, lumbar region: Secondary | ICD-10-CM

## 2022-12-14 ENCOUNTER — Other Ambulatory Visit: Payer: Self-pay | Admitting: Neurosurgery

## 2022-12-14 DIAGNOSIS — M4714 Other spondylosis with myelopathy, thoracic region: Secondary | ICD-10-CM

## 2022-12-28 ENCOUNTER — Other Ambulatory Visit: Payer: Federal, State, Local not specified - PPO

## 2023-01-11 ENCOUNTER — Ambulatory Visit
Admission: RE | Admit: 2023-01-11 | Discharge: 2023-01-11 | Disposition: A | Discharge status: Home / Self Care | Payer: Federal, State, Local not specified - PPO | Source: Ambulatory Visit | Attending: Neurosurgery | Admitting: Neurosurgery

## 2023-01-11 DIAGNOSIS — M4714 Other spondylosis with myelopathy, thoracic region: Secondary | ICD-10-CM

## 2023-05-29 ENCOUNTER — Encounter (HOSPITAL_COMMUNITY): Payer: Self-pay

## 2023-05-29 ENCOUNTER — Emergency Department (HOSPITAL_COMMUNITY): Payer: Federal, State, Local not specified - PPO

## 2023-05-29 ENCOUNTER — Emergency Department (HOSPITAL_COMMUNITY)
Admission: EM | Admit: 2023-05-29 | Discharge: 2023-05-29 | Disposition: A | Discharge status: Home / Self Care | Payer: Federal, State, Local not specified - PPO | Attending: Emergency Medicine | Admitting: Emergency Medicine

## 2023-05-29 ENCOUNTER — Other Ambulatory Visit: Payer: Self-pay

## 2023-05-29 DIAGNOSIS — I10 Essential (primary) hypertension: Secondary | ICD-10-CM | POA: Diagnosis not present

## 2023-05-29 DIAGNOSIS — M25561 Pain in right knee: Secondary | ICD-10-CM | POA: Insufficient documentation

## 2023-05-29 DIAGNOSIS — Z79899 Other long term (current) drug therapy: Secondary | ICD-10-CM | POA: Insufficient documentation

## 2023-05-29 MED ORDER — HYDROCODONE-ACETAMINOPHEN 5-325 MG PO TABS: 1.0000 | ORAL_TABLET | ORAL | 0 refills | Status: AC | PRN

## 2023-05-29 MED ORDER — PREDNISONE 20 MG PO TABS: ORAL_TABLET | ORAL | 0 refills | Status: DC

## 2023-05-29 MED ORDER — OXYCODONE-ACETAMINOPHEN 5-325 MG PO TABS
2.0000 | ORAL_TABLET | Freq: Once | ORAL | Status: AC
  Administered 2023-05-29: 2 via ORAL
  Filled 2023-05-29: qty 2

## 2023-05-29 MED ORDER — PREDNISONE 20 MG PO TABS
60.0000 mg | ORAL_TABLET | Freq: Once | ORAL | Status: AC
  Administered 2023-05-29: 60 mg via ORAL
  Filled 2023-05-29: qty 3

## 2023-05-29 NOTE — Discharge Instructions (Signed)
Take the prescribed medication as directed. °Follow-up with your orthopedist if ongoing issues. °Return to the ED for new or worsening symptoms. °

## 2023-05-29 NOTE — ED Triage Notes (Signed)
Pt coming in with right knee pain. Pt reports a history of issues with that knee. Pt reports pain woke him up from sleep. Pt reports taking tylenol at 1230am. Pt took ibuprofen at 6pm. Pt reports no relief with with tylenol and ibuprofen.

## 2023-05-29 NOTE — ED Provider Notes (Signed)
[place] [facility] AT [facility] Provider Note   CSN: [id number] Arrival date & time: 05/29/23  6578     History  Chief Complaint  Patient presents with   Knee Pain    Martin [name] is a 53 y.o. male.  The history is provided by the patient and medical records.  Knee Pain  53 year old male with history of hypertension, hyperlipidemia, gout, chronic kidney disease, presenting to the ED with right knee pain.  This is a chronic issue for him but worse recently.  Saw orthopedist recently and had an injection in left knee, shortly after started having worsening pain in his right knee.  He was started on medrol dosepak for this and symptoms did transiently get better but is now worsened again.  Does have history of gout, states it feels similar.  He has not had any fever or chills.  Home Medications Prior to Admission medications   Medication Sig Start Date End Date Taking? Authorizing Provider  acetaminophen (TYLENOL) 500 MG tablet Take 1,000 mg by mouth every 6 (six) hours as needed for pain.    [provider]  amLODipine (NORVASC) 10 MG tablet Take 10 mg by mouth daily.      [provider]  clotrimazole-betamethasone (LOTRISONE) cream Apply 1 application topically daily as needed. 10/22/18   [provider]  cyclobenzaprine (FLEXERIL) 10 MG tablet Take 1 tablet (10 mg total) by mouth 2 (two) times daily as needed for muscle spasms. 01/19/21   Curatolo, Adam, DO  ibuprofen (ADVIL) 200 MG tablet Take 200 mg by mouth every 6 (six) hours as needed.    [provider]  losartan (COZAAR) 100 MG tablet Take 100 mg by mouth every evening.    [provider]  Menthol, Topical Analgesic, (BIOFREEZE) 10 % CREA Apply topically.    [provider]  pantoprazole (PROTONIX) 40 MG tablet Take 40 mg by mouth daily. 09/08/21   [provider]  rosuvastatin (CRESTOR) 10 MG tablet Take 1 tablet (10 mg total) by mouth  daily. 12/25/18   Yates Decamp, MD  spironolactone (ALDACTONE) 25 MG tablet TAKE 1 TABLET BY MOUTH EVERY DAY 01/20/20   Yates Decamp, MD      Allergies    Benazepril hcl    Review of Systems   Review of Systems  Musculoskeletal:  Positive for arthralgias.  All other systems reviewed and are negative.   Physical Exam Updated Vital Signs BP (!) 141/86   Pulse 73   Temp 98.3 F (36.8 C)   Resp 18   Ht 6\' 4"  (1.93 m)   Wt (!) 170.1 kg   SpO2 96%   BMI 45.65 kg/m   Physical Exam Vitals and nursing note reviewed.  Constitutional:      Appearance: He is well-developed.  HENT:     Head: Normocephalic and atraumatic.  Eyes:     Conjunctiva/sclera: Conjunctivae normal.     Pupils: Pupils are equal, round, and reactive to light.  Cardiovascular:     Rate and Rhythm: Normal rate and regular rhythm.     Heart sounds: Normal heart sounds.  Pulmonary:     Effort: Pulmonary effort is normal. No respiratory distress.     Breath sounds: Normal breath sounds. No rhonchi.  Abdominal:     General: Bowel sounds are normal.     Palpations: Abdomen is soft.     Tenderness: There is no abdominal tenderness. There is no rebound.  Musculoskeletal:  General: Normal range of motion.     Cervical back: Normal range of motion.     Comments: Right knee without acute deformity, no significant swelling, no erythema or induration, no warmth to touch  Skin:    General: Skin is warm and dry.  Neurological:     Mental Status: He is alert and oriented to person, place, and time.     ED Results / Procedures / Treatments   Labs (all labs ordered are listed, but only abnormal results are displayed) Labs Reviewed - No data to display  EKG None  Radiology DG Knee 3 Views Right Result Date: 05/29/2023 CLINICAL DATA:  53 year old male with pain which woke him from sleep. EXAM: RIGHT KNEE - 3 VIEW COMPARISON:  Right knee series 02/08/2005. FINDINGS: Bone mineralization remains within normal  limits. Since 2006 medial compartment joint space loss is moderate to severe (image #2) with moderate degenerative spurring there. Mild lateral and patellofemoral compartment degeneration since 2006. Trace or small suprapatellar joint effusion suspected on cross-table lateral view. No acute osseous abnormality identified. IMPRESSION: 1. No acute osseous abnormality identified about the right knee. 2. 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 Trace joint effusion suspected. Electronically Signed   By: Odessa Fleming M.D.   On: 05/29/2023 04:42    Procedures Procedures    Medications Ordered in ED Medications - No data to display  ED Course/ Medical Decision Making/ A&P                                 Medical Decision Making Amount and/or Complexity of Data Reviewed Radiology: ordered and independent interpretation performed.  Risk Prescription drug management.   53 y.o. M here with right knee pain.  Acute on chronic issue.  Does have hx of gout which felt similar.  Afebrile, non-toxic.  No significant swelling or induration on exam.  No warmth to touch.  Doubt septic joint.  X-ray with advanced arthritic findings.  Suspect this is likely recurrent gout flare.  Will start trial of steroids which he has tolerated well in the past.  Avoid NSAIDs given CKD.  Short course hydrocodone.  Work note given.  Advised to follow-up with orthopedics if ongoing issues.  Return here for new concerns.  Final Clinical Impression(s) / ED Diagnoses Final diagnoses:  Acute pain of right knee    Rx / DC Orders ED Discharge Orders          Ordered    HYDROcodone-acetaminophen (NORCO/VICODIN) 5-325 MG tablet  Every 4 hours PRN        05/29/23 0535    predniSONE (DELTASONE) 20 MG tablet        05/29/23 0535              Garlon Hatchet, PA-C 05/29/23 0610    Melene Plan, DO 05/29/23 [phone]

## 2023-07-21 ENCOUNTER — Encounter (HOSPITAL_COMMUNITY): Payer: Self-pay

## 2023-07-21 ENCOUNTER — Emergency Department (HOSPITAL_COMMUNITY)
Admission: EM | Admit: 2023-07-21 | Discharge: 2023-07-22 | Disposition: A | Discharge status: Home / Self Care | Attending: Emergency Medicine | Admitting: Emergency Medicine

## 2023-07-21 ENCOUNTER — Ambulatory Visit (HOSPITAL_COMMUNITY): Admission: EM | Admit: 2023-07-21 | Discharge: 2023-07-21 | Disposition: A | Discharge status: Home / Self Care

## 2023-07-21 ENCOUNTER — Encounter (HOSPITAL_COMMUNITY): Payer: Self-pay | Admitting: *Deleted

## 2023-07-21 ENCOUNTER — Emergency Department (HOSPITAL_COMMUNITY)

## 2023-07-21 ENCOUNTER — Other Ambulatory Visit: Payer: Self-pay

## 2023-07-21 DIAGNOSIS — I10 Essential (primary) hypertension: Secondary | ICD-10-CM | POA: Diagnosis not present

## 2023-07-21 DIAGNOSIS — Z79899 Other long term (current) drug therapy: Secondary | ICD-10-CM | POA: Diagnosis not present

## 2023-07-21 DIAGNOSIS — M25522 Pain in left elbow: Secondary | ICD-10-CM

## 2023-07-21 DIAGNOSIS — Y9389 Activity, other specified: Secondary | ICD-10-CM | POA: Insufficient documentation

## 2023-07-21 DIAGNOSIS — M7022 Olecranon bursitis, left elbow: Secondary | ICD-10-CM | POA: Diagnosis not present

## 2023-07-21 DIAGNOSIS — M25422 Effusion, left elbow: Secondary | ICD-10-CM

## 2023-07-21 LAB — COMPREHENSIVE METABOLIC PANEL
ALT: 23 U/L (ref 0–44)
AST: 21 U/L (ref 15–41)
Albumin: 4 g/dL (ref 3.5–5.0)
Alkaline Phosphatase: 51 U/L (ref 38–126)
Anion gap: 10 (ref 5–15)
BUN: 18 mg/dL (ref 6–20)
CO2: 27 mmol/L (ref 22–32)
Calcium: 10.1 mg/dL (ref 8.9–10.3)
Chloride: 101 mmol/L (ref 98–111)
Creatinine, Ser: 1.3 mg/dL — ABNORMAL HIGH (ref 0.61–1.24)
GFR, Estimated: >60 mL/min (ref >60)
Glucose, Bld: 141 mg/dL — ABNORMAL HIGH (ref 70–99)
Potassium: 3.8 mmol/L (ref 3.5–5.1)
Sodium: 138 mmol/L (ref 135–145)
Total Bilirubin: 0.7 mg/dL (ref 0.0–1.2)
Total Protein: 7.2 g/dL (ref 6.5–8.1)

## 2023-07-21 LAB — CBC
HCT: 45.9 % (ref 39.0–52.0)
Hemoglobin: 14.6 g/dL (ref 13.0–17.0)
MCH: 26 pg (ref 26.0–34.0)
MCHC: 31.8 g/dL (ref 30.0–36.0)
MCV: 81.8 fL (ref 80.0–100.0)
Platelets: 245 10*3/uL (ref 150–400)
RBC: 5.61 MIL/uL (ref 4.22–5.81)
RDW: 17.3 % — ABNORMAL HIGH (ref 11.5–15.5)
WBC: 7.6 10*3/uL (ref 4.0–10.5)
nRBC: 0 % (ref 0.0–0.2)

## 2023-07-21 LAB — URIC ACID: Uric Acid, Serum: 8.3 mg/dL (ref 3.7–8.6)

## 2023-07-21 NOTE — ED Triage Notes (Signed)
 The pt has had a swollen painful lt elbow since Thursday  no known injury hx of gout he has also had a temp today he was seen at [facility] and sent here for treatment

## 2023-07-21 NOTE — ED Provider Notes (Signed)
 [facility]    CSN: [id number] Arrival date & time: 07/21/23  1615      History   Chief Complaint No chief complaint on file.   HPI Martin [name] is a 53 y.o. male.   Martin [name] is a 53 y.o. male presenting for chief complaint of left elbow pain that started 4 days ago without known injury/trauma. Pain started gradually and has become more severe over the last 24 hours. Pain is localized to the left posterior elbow and does not radiate to the forearm or shoulder. He states swelling to the left elbow has worsened in the last 48 hours. History of gout and CKD, reports recent intake of fish/hamburger. He has never had gout to the elbow. Denies recent fever, chills, headache, numbness/tingling to the extremities. Temp 99.8 here without recent antipyretics. He has been taking hydrocodone-acetaminophen for pain without relief.      Past Medical History:  Diagnosis Date   Gout    Hyperlipidemia    Hypertension    Renal disorder     Patient Active Problem List   Diagnosis Date Noted   COVID-19 virus infection 11/15/2018   GERD 01/12/2007   Hypercholesterolemia 12/30/2006   GOUT NOS 12/30/2006   Essential hypertension 12/30/2006   BACK PAIN, LUMBAR 10/08/2006   FATTY LIVER DISEASE, HX OF 02/22/2006    Past Surgical History:  Procedure Laterality Date   BACK SURGERY         Home Medications    Prior to Admission medications   Medication Sig Start Date End Date Taking? Authorizing Provider  acetaminophen (TYLENOL) 500 MG tablet Take 1,000 mg by mouth every 6 (six) hours as needed for pain.   Yes [provider]  amLODipine (NORVASC) 10 MG tablet Take 10 mg by mouth daily.     Yes [provider]  clotrimazole-betamethasone (LOTRISONE) cream Apply 1 application topically daily as needed. 10/22/18  Yes [provider]  cyclobenzaprine (FLEXERIL) 10 MG tablet Take 1 tablet (10 mg total) by mouth 2 (two) times daily as needed for  muscle spasms. 01/19/21  Yes Curatolo, Adam, DO  HYDROcodone-acetaminophen (NORCO/VICODIN) 5-325 MG tablet Take 1 tablet by mouth every 4 (four) hours as needed. 05/29/23  Yes Garlon Hatchet, PA-C  ibuprofen (ADVIL) 200 MG tablet Take 200 mg by mouth every 6 (six) hours as needed.   Yes [provider]  losartan (COZAAR) 100 MG tablet Take 100 mg by mouth every evening.   Yes [provider]  Menthol, Topical Analgesic, (BIOFREEZE) 10 % CREA Apply topically.   Yes [provider]  pantoprazole (PROTONIX) 40 MG tablet Take 40 mg by mouth daily. 09/08/21  Yes [provider]  rosuvastatin (CRESTOR) 10 MG tablet Take 1 tablet (10 mg total) by mouth daily. 12/25/18  Yes Yates Decamp, MD  spironolactone (ALDACTONE) 25 MG tablet TAKE 1 TABLET BY MOUTH EVERY DAY 01/20/20  Yes Yates Decamp, MD  naproxen (NAPROSYN) 500 MG tablet Take 1 tablet (500 mg total) by mouth 2 (two) times daily as needed for moderate pain (pain score 4-6). 07/22/23   Sponseller, Eugene Gavia, PA-C  predniSONE (DELTASONE) 50 MG tablet Take 1 tablet (50 mg total) by mouth daily with breakfast for 5 days. 07/22/23 07/27/23  Sponseller, Eugene Gavia, PA-C    Family History Family History  Problem Relation Age of Onset   Hypertension Mother    Diabetes Mother    Gout Mother    Hypertension Father  Heart failure Father    Cancer Maternal Grandmother    Cancer Maternal Grandfather     Social History Social History   Tobacco Use   Smoking status: Never   Smokeless tobacco: Never  Vaping Use   Vaping status: Never Used  Substance Use Topics   Alcohol use: Yes    Comment: occasionally   Drug use: No     Allergies   Benazepril hcl   Review of Systems Review of Systems Per HPI  Physical Exam Triage Vital Signs ED Triage Vitals  Encounter Vitals Group     BP 07/21/23 1701 (!) 145/79     Systolic BP Percentile --      Diastolic BP Percentile --      Pulse Rate 07/21/23 1701 73     Resp  07/21/23 1701 16     Temp 07/21/23 1701 99.8 F (37.7 C)     Temp Source 07/21/23 1701 Oral     SpO2 07/21/23 1701 97 %     Weight --      Height --      Head Circumference --      Peak Flow --      Pain Score 07/21/23 1707 8     Pain Loc --      Pain Education --      Exclude from Growth Chart --    No data found.  Updated Vital Signs BP (!) 145/79 (BP Location: Left Arm)   Pulse 73   Temp 99.8 F (37.7 C) (Oral)   Resp 16   SpO2 97%   Visual Acuity Right Eye Distance:   Left Eye Distance:   Bilateral Distance:    Right Eye Near:   Left Eye Near:    Bilateral Near:     Physical Exam Vitals and nursing note reviewed.  Constitutional:      Appearance: He is not ill-appearing or toxic-appearing.  HENT:     Head: Normocephalic and atraumatic.     Right Ear: Hearing and external ear normal.     Left Ear: Hearing and external ear normal.     Nose: Nose normal.     Mouth/Throat:     Lips: Pink.  Eyes:     General: Lids are normal. Vision grossly intact. Gaze aligned appropriately.     Extraocular Movements: Extraocular movements intact.     Conjunctiva/sclera: Conjunctivae normal.  Pulmonary:     Effort: Pulmonary effort is normal.  Musculoskeletal:     Right upper arm: Normal.     Right elbow: Swelling present. No deformity, effusion or lacerations. Decreased range of motion (decreased ROM with extension). Tenderness present in olecranon process.     Left elbow: Normal.     Right forearm: Normal.     Cervical back: Neck supple.     Comments: Subtle erythema over the diffuse left elbow joint.   Skin:    General: Skin is warm and dry.     Capillary Refill: Capillary refill takes less than 2 seconds.     Findings: No rash.  Neurological:     General: No focal deficit present.     Mental Status: He is alert and oriented to person, place, and time. Mental status is at baseline.     Cranial Nerves: No dysarthria or facial asymmetry.  Psychiatric:        Mood and  Affect: Mood normal.        Speech: Speech normal.  Behavior: Behavior normal.        Thought Content: Thought content normal.        Judgment: Judgment normal.      UC Treatments / Results  Labs (all labs ordered are listed, but only abnormal results are displayed) Labs Reviewed - No data to display  EKG   Radiology   Procedures Procedures (including critical care time)  Medications Ordered in UC Medications - No data to display  Initial Impression / Assessment and Plan / UC Course  I have reviewed the triage vital signs and the nursing notes.  Pertinent labs & imaging results that were available during my care of the patient were reviewed by me and considered in my medical decision making (see chart for details).   1. Pain and swelling of the left elbow Unclear etiology of left elbow pain. Soft tissue swelling, subtle erythema, and low grade temp concerning for infectious etiology (septic arthritis) versus gouty arthritis.Patient is extremely tender to palpation. Recommend further workup and evaluation in the ER for stat blood work to determine septic arthritis versus acute gouty arthritis.   Discussed clinical concerns/exam findings leading to recommendation for further workup in the ER setting and risks of deferring ER visit with patient/family. Patient/family express understanding and agreement with plan, discharged to ER via private car.    Final Clinical Impressions(s) / UC Diagnoses   Final diagnoses:  Pain and swelling of left elbow   Discharge Instructions   None    ED Prescriptions   None    PDMP not reviewed this encounter.   Carlisle Beers, [place] 07/22/23 2208

## 2023-07-21 NOTE — ED Triage Notes (Signed)
 Denies any recent trauma or injury.

## 2023-07-21 NOTE — ED Triage Notes (Signed)
 He had percocet and tylenol around 1500

## 2023-07-21 NOTE — ED Triage Notes (Signed)
 Here for left arm pain and elbow x 1 week. 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 Patient states 10/10

## 2023-07-22 MED ORDER — FENTANYL CITRATE PF 50 MCG/ML IJ SOSY
50.0000 ug | PREFILLED_SYRINGE | Freq: Once | INTRAMUSCULAR | Status: AC
  Administered 2023-07-22: 50 ug via INTRAVENOUS
  Filled 2023-07-22: qty 1

## 2023-07-22 MED ORDER — PREDNISONE 5 MG PO TABS
50.0000 mg | ORAL_TABLET | Freq: Once | ORAL | Status: AC
  Administered 2023-07-22: 50 mg via ORAL
  Filled 2023-07-22: qty 2

## 2023-07-22 MED ORDER — NAPROXEN 500 MG PO TABS: 500.0000 mg | ORAL_TABLET | Freq: Two times a day (BID) | ORAL | 0 refills | Status: AC | PRN

## 2023-07-22 MED ORDER — KETOROLAC TROMETHAMINE 15 MG/ML IJ SOLN
15.0000 mg | Freq: Once | INTRAMUSCULAR | Status: AC
  Administered 2023-07-22: 15 mg via INTRAVENOUS
  Filled 2023-07-22: qty 1

## 2023-07-22 MED ORDER — PREDNISONE 50 MG PO TABS: 50.0000 mg | ORAL_TABLET | Freq: Every day | ORAL | 0 refills | Status: AC

## 2023-07-22 NOTE — Progress Notes (Signed)
 Orthopedic Tech Progress Note Patient Details:  Martin [name] 18-Sep-1970 [id number]  Ortho Devices Type of Ortho Device: Arm sling Ortho Device/Splint Location: lue Ortho Device/Splint Interventions: Ordered, Application, Adjustment   Post Interventions Patient Tolerated: Well Instructions Provided: Adjustment of device, Care of device  [name] 07/22/2023, 3:41 AM

## 2023-07-22 NOTE — ED Provider Notes (Signed)
 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  No fevers or chills at home, no recent procedure to the elbow, no trauma to the elbow, no history of the same.  Patient with history of chronic bilateral knee pain, history of chronic left shoulder pain, no weakness in the arm.  Patient able to range the joint though it is uncomfortable.  Hydrocodone at home with some improvement, Tylenol as well.  History of gout, states this feels somewhat similar but not the same.  In addition to both history he has history of hypertension, hyperlipidemia.  HPI     Home Medications Prior to Admission medications   Medication Sig Start Date End Date Taking? Authorizing Provider  naproxen (NAPROSYN) 500 MG tablet Take 1 tablet (500 mg total) by mouth 2 (two) times daily as needed for moderate pain (pain score 4-6). 07/22/23  Yes Syair Fricker, Eugene Gavia, PA-C  predniSONE (DELTASONE) 50 MG tablet Take 1 tablet (50 mg total) by mouth daily with breakfast for 5 days. 07/22/23 07/27/23 Yes Maelle Sheaffer, Eugene Gavia, PA-C  acetaminophen (TYLENOL) 500 MG tablet Take 1,000 mg by mouth every 6 (six) hours as needed for pain.    [provider]  amLODipine (NORVASC) 10 MG tablet Take 10 mg by mouth daily.      [provider]  clotrimazole-betamethasone (LOTRISONE) cream Apply 1 application topically daily as needed. 10/22/18   [provider]  cyclobenzaprine (FLEXERIL) 10 MG tablet Take 1 tablet (10 mg total) by mouth 2 (two) times daily as needed for muscle spasms. 01/19/21   Curatolo, Adam, DO  HYDROcodone-acetaminophen (NORCO/VICODIN) 5-325 MG tablet  Take 1 tablet by mouth every 4 (four) hours as needed. 05/29/23   Garlon Hatchet, PA-C  ibuprofen (ADVIL) 200 MG tablet Take 200 mg by mouth every 6 (six) hours as needed.    [provider]  losartan (COZAAR) 100 MG tablet Take 100 mg by mouth every evening.    [provider]  Menthol, Topical Analgesic, (BIOFREEZE) 10 % CREA Apply topically.    [provider]  pantoprazole (PROTONIX) 40 MG tablet Take 40 mg by mouth daily. 09/08/21   [provider]  rosuvastatin (CRESTOR) 10 MG tablet Take 1 tablet (10 mg total) by mouth daily. 12/25/18   Yates Decamp, MD  spironolactone (ALDACTONE) 25 MG tablet TAKE 1 TABLET BY MOUTH EVERY DAY 01/20/20   Yates Decamp, MD      Allergies    Benazepril hcl    Review of Systems   Review of Systems  Musculoskeletal:        L elbow pain    Physical Exam Updated Vital Signs BP 124/75   Pulse 69   Temp 98.8 F (37.1 C) (Oral)   Resp 18   Ht 6\' 4"  (1.93 m)   Wt (!) 170.1 kg   SpO2 99%   BMI 45.65 kg/m  Physical Exam Vitals and nursing note reviewed.  Constitutional:      Appearance: He is obese. He is not ill-appearing or toxic-appearing.  HENT:     Head: Normocephalic and atraumatic.  Eyes:  General: No scleral icterus.       Right eye: No discharge.        Left eye: No discharge.     Conjunctiva/sclera: Conjunctivae normal.  Pulmonary:     Effort: Pulmonary effort is normal.  Musculoskeletal:     Left shoulder: Normal.     Left upper arm: Normal.     Left elbow: Swelling present. Normal range of motion. Tenderness present in olecranon process.     Right forearm: Normal.     Left forearm: Normal.     Right wrist: Normal.     Left wrist: Normal.     Right hand: Normal.     Left hand: Normal.       Arms:     Comments: 2+ radial pulses bilaterally, symmetric strength and sensation in grip  Skin:    General: Skin is warm and dry.     Capillary Refill: Capillary refill takes less than 2 seconds.   Neurological:     General: No focal deficit present.     Mental Status: He is alert and oriented to person, place, and time.     Sensory: Sensation is intact.     Motor: Motor function is intact.  Psychiatric:        Mood and Affect: Mood normal.     ED Results / Procedures / Treatments   Labs (all labs ordered are listed, but only abnormal results are displayed) Labs Reviewed  COMPREHENSIVE METABOLIC PANEL - Abnormal; Notable for the following components:      Result Value   Glucose, Bld 141 (*)    Creatinine, Ser 1.30 (*)    All other components within normal limits  CBC - Abnormal; Notable for the following components:   RDW 17.3 (*)    All other components within normal limits  URIC ACID    EKG None  Radiology DG Elbow Complete Left Result Date: 07/21/2023 CLINICAL DATA:  Left elbow pain and swelling EXAM: LEFT ELBOW - COMPLETE 3+ VIEW COMPARISON:  None Available. FINDINGS: There is no evidence of fracture, dislocation, or joint effusion. There is no evidence of arthropathy or other focal bone abnormality. Soft tissues are unremarkable. IMPRESSION: Negative. Electronically Signed   By: Duanne Guess D.O.   On: 07/21/2023 19:54    Procedures Procedures    Medications Ordered in ED Medications  fentaNYL (SUBLIMAZE) injection 50 mcg (50 mcg Intravenous Given 07/22/23 0131)  ketorolac (TORADOL) 15 MG/ML injection 15 mg (15 mg Intravenous Given 07/22/23 0130)  predniSONE (DELTASONE) tablet 50 mg (50 mg Oral Given 07/22/23 0134)    ED Course/ Medical Decision Making/ A&P                                 Medical Decision Making 53 year old male presents with left elbow pain.  Vitals normal on intake.  Neurovascularly intact in bilateral lower extremities.  Swelling only over the olecranon on the left as above.  No erythema.  DDx includes under to olecranon bursitis, epicondylitis, septic bursitis, acute fracture or dislocation  Amount and/or Complexity of Data  Reviewed Labs:     Details: CBC unremarkable, CMP with creatinine of 1.3 near patient's baseline.  Uric acid is unremarkable,   Radiology:     Details: Plain film the left elbow unremarkable.   Risk Prescription drug management.   Clinical picture most consistent with acute olecranon bursitis.  Pain significantly proved with analgesia in emergency  department, will discharge with sling, NSAIDs, and close outpatient follow-up with orthopedics.  Clinical concern for infectious or emergent underlying etiology of this patient's presentation the morbidity where inpatient management is exceedingly low.  Deadrian voiced understanding of his medical evaluation and treatment plan. Each of their questions answered to their expressed satisfaction.  Return precautions were given.  Patient is well-appearing, stable, and was discharged in good condition.  This chart was dictated using voice recognition software, Dragon. Despite the best efforts of this provider to proofread and correct errors, errors may still occur which can change documentation meaning.         Final Clinical Impression(s) / ED Diagnoses Final diagnoses:  Olecranon bursitis of left elbow    Rx / DC Orders ED Discharge Orders          Ordered    naproxen (NAPROSYN) 500 MG tablet  2 times daily PRN        07/22/23 0314    predniSONE (DELTASONE) 50 MG tablet  Daily with breakfast        07/22/23 0314              Gerrod Maule, Eugene Gavia, PA-C 07/22/23 0343    Gloris Manchester, MD 07/22/23 [phone]

## 2023-07-22 NOTE — Discharge Instructions (Addendum)
 You were seen in the ER today for your elbow pain. Please take the prescribed steroid and antiinflammatory medication.  You may take additional Tylenol to these medications but he should not take other NSAID medication such as ibuprofen until as long as you are taking the naproxen.  Please follow-up with your orthopedic surgeon or the orthopedist listed below.  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  Return to the ER with any fevers chills, redness swelling, worsening pain in the joint, or any other new severe symptom.

## 2024-04-25 ENCOUNTER — Encounter (HOSPITAL_COMMUNITY): Payer: Self-pay

## 2024-04-25 ENCOUNTER — Other Ambulatory Visit: Payer: Self-pay

## 2024-04-25 ENCOUNTER — Emergency Department (HOSPITAL_COMMUNITY)
Admission: EM | Admit: 2024-04-25 | Discharge: 2024-04-25 | Disposition: A | Discharge status: Home / Self Care | Attending: Student in an Organized Health Care Education/Training Program | Admitting: Student in an Organized Health Care Education/Training Program

## 2024-04-25 ENCOUNTER — Emergency Department (HOSPITAL_COMMUNITY)

## 2024-04-25 DIAGNOSIS — Z79899 Other long term (current) drug therapy: Secondary | ICD-10-CM | POA: Insufficient documentation

## 2024-04-25 DIAGNOSIS — M545 Low back pain, unspecified: Secondary | ICD-10-CM | POA: Diagnosis present

## 2024-04-25 DIAGNOSIS — R7401 Elevation of levels of liver transaminase levels: Secondary | ICD-10-CM | POA: Diagnosis not present

## 2024-04-25 DIAGNOSIS — I10 Essential (primary) hypertension: Secondary | ICD-10-CM | POA: Insufficient documentation

## 2024-04-25 LAB — URINALYSIS, ROUTINE W REFLEX MICROSCOPIC
Bilirubin Urine: NEGATIVE
Glucose, UA: NEGATIVE mg/dL
Hgb urine dipstick: NEGATIVE
Ketones, ur: NEGATIVE mg/dL
Leukocytes,Ua: NEGATIVE
Nitrite: NEGATIVE
Protein, ur: 30 mg/dL — AB
Specific Gravity, Urine: 1.025 (ref 1.005–1.030)
pH: 6 (ref 5.0–8.0)

## 2024-04-25 LAB — COMPREHENSIVE METABOLIC PANEL WITH GFR
ALT: 35 U/L (ref 0–44)
AST: 46 U/L — ABNORMAL HIGH (ref 15–41)
Albumin: 4.8 g/dL (ref 3.5–5.0)
Alkaline Phosphatase: 53 U/L (ref 38–126)
Anion gap: 10 (ref 5–15)
BUN: 14 mg/dL (ref 6–20)
CO2: 26 mmol/L (ref 22–32)
Calcium: 10 mg/dL (ref 8.9–10.3)
Chloride: 104 mmol/L (ref 98–111)
Creatinine, Ser: 1.23 mg/dL (ref 0.61–1.24)
GFR, Estimated: >60 mL/min
Glucose, Bld: 98 mg/dL (ref 70–99)
Potassium: 4.7 mmol/L (ref 3.5–5.1)
Sodium: 139 mmol/L (ref 135–145)
Total Bilirubin: 0.5 mg/dL (ref 0.0–1.2)
Total Protein: 7.6 g/dL (ref 6.5–8.1)

## 2024-04-25 LAB — CBC WITH DIFFERENTIAL/PLATELET
Abs Immature Granulocytes: 0 K/uL (ref 0.00–0.07)
Basophils Absolute: 0.1 K/uL (ref 0.0–0.1)
Basophils Relative: 1 %
Eosinophils Absolute: 0.2 K/uL (ref 0.0–0.5)
Eosinophils Relative: 3 %
HCT: 46.2 % (ref 39.0–52.0)
Hemoglobin: 14.5 g/dL (ref 13.0–17.0)
Immature Granulocytes: 0 %
Lymphocytes Relative: 27 %
Lymphs Abs: 1.2 K/uL (ref 0.7–4.0)
MCH: 26.1 pg (ref 26.0–34.0)
MCHC: 31.4 g/dL (ref 30.0–36.0)
MCV: 83.2 fL (ref 80.0–100.0)
Monocytes Absolute: 0.5 K/uL (ref 0.1–1.0)
Monocytes Relative: 11 %
Neutro Abs: 2.5 K/uL (ref 1.7–7.7)
Neutrophils Relative %: 58 %
Platelets: 225 K/uL (ref 150–400)
RBC: 5.55 MIL/uL (ref 4.22–5.81)
RDW: 16.2 % — ABNORMAL HIGH (ref 11.5–15.5)
WBC: 4.4 K/uL (ref 4.0–10.5)
nRBC: 0 % (ref 0.0–0.2)

## 2024-04-25 LAB — URINALYSIS, MICROSCOPIC (REFLEX): Bacteria, UA: NONE SEEN

## 2024-04-25 LAB — LIPASE, BLOOD: Lipase: 39 U/L (ref 11–51)

## 2024-04-25 MED ORDER — LIDOCAINE 5 % EX PTCH
2.0000 | MEDICATED_PATCH | CUTANEOUS | Status: DC
  Administered 2024-04-25: 2 via TRANSDERMAL
  Filled 2024-04-25: qty 2

## 2024-04-25 MED ORDER — METHYLPREDNISOLONE 4 MG PO TBPK: ORAL_TABLET | ORAL | 0 refills | Status: AC

## 2024-04-25 MED ORDER — KETOROLAC TROMETHAMINE 15 MG/ML IJ SOLN
15.0000 mg | Freq: Once | INTRAMUSCULAR | Status: AC
  Administered 2024-04-25: 15 mg via INTRAMUSCULAR
  Filled 2024-04-25: qty 1

## 2024-04-25 MED ORDER — OXYCODONE-ACETAMINOPHEN 5-325 MG PO TABS
1.0000 | ORAL_TABLET | ORAL | Status: DC | PRN
  Administered 2024-04-25: 1 via ORAL
  Filled 2024-04-25: qty 1

## 2024-04-25 MED ORDER — LIDOCAINE 5 % EX PTCH: 1.0000 | MEDICATED_PATCH | CUTANEOUS | 0 refills | Status: AC

## 2024-04-25 NOTE — Discharge Instructions (Addendum)
 Continue Tylenol  and lidocaine  patches as needed for pain.  Start Medrol  Dosepak and use as directed.  Contact your neurosurgeon, Dr. Louis, and schedule a follow-up if your symptoms persist.  Return to the emergency department if your symptoms worsen.

## 2024-04-25 NOTE — ED Notes (Signed)
 Went to draw blood in Triage 5. Patient in CT. KIT

## 2024-04-25 NOTE — ED Provider Notes (Signed)
 " [place] EMERGENCY DEPARTMENT AT [facility] Provider Note   CSN: [id number] Arrival date & time: 04/25/24  1233     Patient presents with: Back Pain   Martin [name] is a 53 y.o. male.   53 year old male presenting with back pain.  Patient notes that his back pain began sometime on Thursday, denies fall/inciting event/injury, reports that pain began when he was getting out of his car.  Patient mentions that he lifts weights weekly and did lift weights on Wednesday, he is wondering if this may have contributed to the onset of his pain.  Describes midline lower back pain that radiates to his flanks, no radiation down his buttocks/legs.  Denies lower extremity weakness/sensory deficits, no fever, no bowel/bladder incontinence, no dysuria, no chest pain, no shortness of breath.  History of prior back surgery 22 years prior, sees Dr. Louis with neurosurgery.  Has tried Tylenol  with minimal relief of symptoms.  Endorses some symptomatic improvement after receiving Percocet in triage.   Back Pain      Prior to Admission medications  Medication Sig Start Date End Date Taking? Authorizing Provider  acetaminophen  (TYLENOL ) 500 MG tablet Take 1,000 mg by mouth every 6 (six) hours as needed for pain.    [provider]  amLODipine  (NORVASC ) 10 MG tablet Take 10 mg by mouth daily.      [provider]  clotrimazole-betamethasone (LOTRISONE) cream Apply 1 application topically daily as needed. 10/22/18   [provider]  cyclobenzaprine  (FLEXERIL ) 10 MG tablet Take 1 tablet (10 mg total) by mouth 2 (two) times daily as needed for muscle spasms. 01/19/21   Curatolo, Adam, DO  HYDROcodone -acetaminophen  (NORCO/VICODIN) 5-325 MG tablet Take 1 tablet by mouth every 4 (four) hours as needed. 05/29/23   Jarold Olam HERO, PA-C  ibuprofen (ADVIL) 200 MG tablet Take 200 mg by mouth every 6 (six) hours as needed.    [provider]  losartan  (COZAAR ) 100 MG tablet  Take 100 mg by mouth every evening.    [provider]  Menthol, Topical Analgesic, (BIOFREEZE) 10 % CREA Apply topically.    [provider]  naproxen  (NAPROSYN ) 500 MG tablet Take 1 tablet (500 mg total) by mouth 2 (two) times daily as needed for moderate pain (pain score 4-6). 07/22/23   Sponseller, Pleasant SAUNDERS, PA-C  pantoprazole  (PROTONIX ) 40 MG tablet Take 40 mg by mouth daily. 09/08/21   [provider]  rosuvastatin  (CRESTOR ) 10 MG tablet Take 1 tablet (10 mg total) by mouth daily. 12/25/18   Ladona Heinz, MD  spironolactone  (ALDACTONE ) 25 MG tablet TAKE 1 TABLET BY MOUTH EVERY DAY 01/20/20   Ladona Heinz, MD    Allergies: Benazepril hcl    Review of Systems  Musculoskeletal:  Positive for back pain.    Updated Vital Signs  Vitals:   04/25/24 1236 04/25/24 1238  BP: (!) 147/95   Pulse: 70   Resp: 20   Temp: 98.3 F (36.8 C)   TempSrc: Oral   SpO2: 96%   Weight:  (!) 162.8 kg  Height:  6' 4 (1.93 m)     Physical Exam Vitals and nursing note reviewed.  HENT:     Head: Normocephalic.  Eyes:     Extraocular Movements: Extraocular movements intact.  Cardiovascular:     Rate and Rhythm: Normal rate and regular rhythm.     Heart sounds: Normal heart sounds.  Pulmonary:     Effort: Pulmonary effort is normal.  Breath sounds: Normal breath sounds.  Musculoskeletal:        General: Normal range of motion.     Cervical back: Normal range of motion.     Comments: Moves all extremities spontaneously without difficulty 5/5 strength against resistance of bilateral upper and lower extremities Back: No midline spinous process TTP, no bony deformity/step-off, vertical surgical scar noted to lumbar spine.  Mild tenderness in the region of the paralumbar muscles R>L.    Skin:    General: Skin is warm and dry.  Neurological:     Mental Status: He is alert and oriented to person, place, and time.     Sensory: No sensory deficit.     Motor: No weakness.      Comments: Ambulates on his own without difficulty     (all labs ordered are listed, but only abnormal results are displayed) Labs Reviewed  CBC WITH DIFFERENTIAL/PLATELET - Abnormal; Notable for the following components:      Result Value   RDW 16.2 (*)    All other components within normal limits  COMPREHENSIVE METABOLIC PANEL WITH GFR - Abnormal; Notable for the following components:   AST 46 (*)    All other components within normal limits  URINALYSIS, ROUTINE W REFLEX MICROSCOPIC - Abnormal; Notable for the following components:   Protein, ur 30 (*)    All other components within normal limits  LIPASE, BLOOD  URINALYSIS, MICROSCOPIC (REFLEX)    EKG: None  Radiology: CT Renal Stone Study Result Date: 04/25/2024 EXAM: CT UROGRAM 04/25/2024 01:54:00 PM TECHNIQUE: CT of the abdomen and pelvis was performed before and after the administration of intravenous contrast as per CT urogram protocol. Multiplanar reformatted images as well as MIP urogram images are provided for review. Automated exposure control, iterative reconstruction, and/or weight based adjustment of the mA/kV was utilized to reduce the radiation dose to as low as reasonably achievable. COMPARISON: 01/19/2021 CLINICAL HISTORY: Abdominal/flank pain, stone suspected. FINDINGS: LOWER CHEST: No acute abnormality. LIVER: The liver is unremarkable. GALLBLADDER AND BILE DUCTS: Gallbladder is unremarkable. No biliary ductal dilatation. SPLEEN: No acute abnormality. PANCREAS: No acute abnormality. ADRENAL GLANDS: No acute abnormality. KIDNEYS, URETERS AND BLADDER: Small nonobstructive right upper pole calyceal calculus. No hydronephrosis in either kidney. No stones in the ureters. No perinephric or periureteral stranding. Mild circumferential wall thickening of the urinary bladder, which may be due to underdistention or acute cystitis. Correlation with urinalysis should be considered, as clinically warranted. GI AND BOWEL: Stomach  demonstrates no acute abnormality. Decompressed normal appendix. There is no bowel obstruction. PERITONEUM AND RETROPERITONEUM: No ascites. No free air. VASCULATURE: Aorta is normal in caliber. Multivessel coronary atherosclerosis. Scattered aortic atherosclerosis. LYMPH NODES: No lymphadenopathy. REPRODUCTIVE ORGANS: No prostatomegaly. 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 Symmetric small volume bilateral gynecomastia. IMPRESSION: 1. Small nonobstructive right upper pole calyceal calculus. No hydronephrosis in either kidney. 2. Mild circumferential urinary bladder wall thickening, possibly due to underdistention or acute cystitis; recommend correlation with urinalysis as clinically warranted. Electronically signed by: Rogelia Myers MD 04/25/2024 02:11 PM EST RP Workstation: [id number]     Procedures   Medications Ordered in the ED  oxyCODONE -acetaminophen  (PERCOCET/ROXICET) 5-325 MG per tablet 1 tablet (1 tablet Oral Given 04/25/24 1242)  lidocaine  (LIDODERM ) 5 % 2 patch (has no administration in time range)  ketorolac  (TORADOL ) 15 MG/ML injection 15 mg (15 mg Intramuscular Given 04/25/24 1548)  Medical Decision Making This patient presents to the ED for concern of back pain, this involves an extensive number of treatment options, and is a complaint that carries with it a high risk of complications and morbidity.  The differential diagnosis includes muscle strain/sprain, degenerative disc disease, disc herniation, fracture, cauda equina   Co morbidities that complicate the patient evaluation  Prior lumbar spine surgery, hypertension   Additional history obtained:  Additional history obtained from record review External records from outside source obtained and reviewed including prior ED note   Lab Tests:  I Ordered, and personally interpreted labs.  The pertinent  results include: CBC unremarkable.  CMP with borderline elevation in AST at 46, otherwise within normal limits.  Urinalysis notable for protein but no evidence of infection with negative nitrite/leukocytes.  Lipase within normal limits.    Imaging Studies ordered:  I ordered imaging studies including CT renal stone study  I independently visualized and interpreted imaging which showed   1. Small nonobstructive right upper pole calyceal calculus. No hydronephrosis in either kidney. 2. Mild circumferential urinary bladder wall thickening, possibly due to underdistention or acute cystitis; recommend correlation with urinalysis as clinically warranted.  I agree with the radiologist interpretation   Cardiac Monitoring: / EKG:  The patient was maintained on a cardiac monitor.  I personally viewed and interpreted the cardiac monitored which showed an underlying rhythm of: NSR   Problem List / ED Course / Critical interventions / Medication management  Percocet ordered in triage I ordered medication including Toradol  and lidocaine  patches for pain Reevaluation of the patient after these medicines showed that the patient improved I have reviewed the patients home medicines and have made adjustments as needed   Test / Admission - Considered:  Physical exam is largely unremarkable as above, there is no midline spinous process tenderness to palpation on exam, patient does have some mild tenderness in the region of the paralumbar muscles bilaterally, 5/5 strength against resistance of bilateral upper and lower extremities, no appreciable sensory deficit or weakness, patient is able to ambulate on his own without difficulty.  CT renal stone study obtained and notable for a small right upper pole calyceal calculus that is nonobstructive and without hydronephrosis, bladder wall thickening however no evidence of UTI.  I do not suspect that the stone noted on imaging is contributing to his symptoms today,  given that it is small and does not appear to be mobile at this time.  I suspect the patient's pain is secondary to his prior degenerative disc disease as noted on imaging vs a muscle strain/sprain.  Patient is followed by neurosurgery, Dr. Louis.  Will administer Toradol  in the emergency department today and prescribe Medrol  Dosepak, I recommend that he continue Tylenol /lidocaine  patches at home and schedule a follow-up with Dr. Louis if his symptoms persist.  No red flag back pain signs/symptoms today, however I discussed these in depth with the patient and he understands the red flags to look out for and is aware of his return precautions.  He is appropriate for discharge at this time.      Risk Prescription drug management.        Final diagnoses:  Low back pain without sciatica, unspecified back pain laterality, unspecified chronicity    ED Discharge Orders          Ordered    methylPREDNISolone  (MEDROL  DOSEPAK) 4 MG TBPK tablet        04/25/24 1548    lidocaine  (LIDODERM ) 5 %  Every 24 hours        04/25/24 1548               Glendia Rocky SAILOR, [place] 04/25/24 1552  "

## 2024-04-25 NOTE — ED Triage Notes (Signed)
 Pt c.o lower to mid back pain that started Thursday. Denies injury/trauma. Pain worse with movement.

## 2024-04-25 NOTE — ED Notes (Signed)
 Patient transported to CT

## 2024-04-25 NOTE — ED Provider Triage Note (Signed)
 Emergency Medicine Provider Triage Evaluation Note  Martin [name] , a 53 y.o. male  was evaluated in triage.  Pt complains of flank pain. Hx of lower back pain but for the past few days he notice pain to L flank radiates to midline worse with movement.  No fever, chills, dysuria, hematuria, leg pain numbness or weakness, no trauma no rash.  No hx of kidney stone  Review of Systems  Positive: As above Negative: As above  Physical Exam  BP (!) 147/95 (BP Location: Left Arm)   Pulse 70   Temp 98.3 F (36.8 C) (Oral)   Resp 20   Ht 6' 4 (1.93 m)   Wt (!) 162.8 kg   SpO2 96%   BMI 43.70 kg/m  Gen:   Awake, no distress   Resp:  Normal effort  MSK:   Moves extremities without difficulty  Other:    Medical Decision Making  Medically screening exam initiated at 1:24 PM.  Appropriate orders placed.  Martin [name] was informed that the remainder of the evaluation will be completed by another provider, this initial triage assessment does not replace that evaluation, and the importance of remaining in the ED until their evaluation is complete.     Nivia Colon, PA-C 04/25/24 1325

## 2024-04-25 NOTE — ED Triage Notes (Signed)
 Patient arrives with family pushing him in a wheelchair, patient reports significant back pain and appears very uncomfortable.
